# Patient Record
Sex: Male | Born: 1935 | Race: Black or African American | Hispanic: No | Marital: Married | Smoking: Never smoker
Health system: Southern US, Community
[De-identification: ages and names within clinical notes are randomized; demographics above are authoritative.]

## PROBLEM LIST (undated history)

## (undated) ENCOUNTER — Emergency Department (HOSPITAL_COMMUNITY): Admission: EM | Disposition: A | Discharge status: Home / Self Care | Payer: Medicare Other

## (undated) DIAGNOSIS — M5030 Other cervical disc degeneration, unspecified cervical region: Secondary | ICD-10-CM

## (undated) DIAGNOSIS — F0390 Unspecified dementia without behavioral disturbance: Secondary | ICD-10-CM

## (undated) DIAGNOSIS — IMO0001 Reserved for inherently not codable concepts without codable children: Secondary | ICD-10-CM

## (undated) DIAGNOSIS — G20 Parkinson's disease: Secondary | ICD-10-CM

## (undated) DIAGNOSIS — J45909 Unspecified asthma, uncomplicated: Secondary | ICD-10-CM

## (undated) DIAGNOSIS — K219 Gastro-esophageal reflux disease without esophagitis: Secondary | ICD-10-CM

## (undated) HISTORY — PX: HERNIA REPAIR: SHX51

## (undated) HISTORY — PX: EYE SURGERY: SHX253

---

## 2009-08-22 ENCOUNTER — Ambulatory Visit: Payer: Self-pay | Admitting: Unknown Physician Specialty

## 2011-06-02 ENCOUNTER — Observation Stay: Payer: Self-pay | Admitting: Internal Medicine

## 2011-06-06 ENCOUNTER — Emergency Department: Payer: Self-pay | Admitting: *Deleted

## 2011-06-12 ENCOUNTER — Ambulatory Visit: Payer: Self-pay | Admitting: Unknown Physician Specialty

## 2011-06-24 ENCOUNTER — Ambulatory Visit: Payer: Self-pay | Admitting: Internal Medicine

## 2011-07-01 ENCOUNTER — Ambulatory Visit: Payer: Self-pay | Admitting: Internal Medicine

## 2011-07-03 ENCOUNTER — Ambulatory Visit: Payer: Self-pay | Admitting: Unknown Physician Specialty

## 2011-07-25 ENCOUNTER — Ambulatory Visit: Payer: Self-pay | Admitting: Internal Medicine

## 2011-12-18 ENCOUNTER — Encounter: Payer: Self-pay | Admitting: Neurology

## 2011-12-23 ENCOUNTER — Encounter: Payer: Self-pay | Admitting: Neurology

## 2011-12-29 ENCOUNTER — Other Ambulatory Visit: Payer: Self-pay | Admitting: Radiology

## 2011-12-29 LAB — CREATININE, SERUM: Creatinine: 1.01 mg/dL (ref 0.60–1.30)

## 2012-01-11 ENCOUNTER — Ambulatory Visit: Payer: Self-pay | Admitting: Unknown Physician Specialty

## 2012-01-11 LAB — CREATININE, SERUM
Creatinine: 1.07 mg/dL (ref 0.60–1.30)
EGFR (Non-African Amer.): >60

## 2014-01-16 ENCOUNTER — Emergency Department: Payer: Self-pay | Admitting: Emergency Medicine

## 2014-06-12 ENCOUNTER — Ambulatory Visit: Payer: Self-pay | Admitting: Physician Assistant

## 2014-06-14 ENCOUNTER — Ambulatory Visit: Payer: Self-pay | Admitting: Surgery

## 2014-06-24 ENCOUNTER — Emergency Department: Payer: Self-pay | Admitting: Emergency Medicine

## 2014-06-24 LAB — URINALYSIS, COMPLETE
BILIRUBIN, UR: NEGATIVE
BLOOD: NEGATIVE
Bacteria: NONE SEEN
GLUCOSE, UR: NEGATIVE mg/dL (ref 0–75)
Leukocyte Esterase: NEGATIVE
Nitrite: NEGATIVE
PROTEIN: NEGATIVE
Ph: 5 (ref 4.5–8.0)
Specific Gravity: 1.017 (ref 1.003–1.030)
WBC UR: 1 /HPF (ref 0–5)

## 2014-06-25 LAB — PROTIME-INR
INR: 1.2
Prothrombin Time: 14.9 s — ABNORMAL HIGH

## 2014-06-25 LAB — COMPREHENSIVE METABOLIC PANEL WITH GFR
Albumin: 4.1 g/dL
Alkaline Phosphatase: 65 U/L
Anion Gap: 7
BUN: 23 mg/dL — ABNORMAL HIGH
Bilirubin,Total: 0.7 mg/dL
Calcium, Total: 8.8 mg/dL
Chloride: 109 mmol/L — ABNORMAL HIGH
Co2: 27 mmol/L
Creatinine: 1.01 mg/dL
EGFR (African American): >60
EGFR (Non-African Amer.): >60
Glucose: 117 mg/dL — ABNORMAL HIGH
Osmolality: 290
Potassium: 4.4 mmol/L
SGOT(AST): 28 U/L
SGPT (ALT): 16 U/L
Sodium: 143 mmol/L
Total Protein: 7.1 g/dL

## 2014-06-25 LAB — CBC
HCT: 35.8 % — ABNORMAL LOW
HGB: 11.8 g/dL — ABNORMAL LOW
MCH: 31.5 pg
MCHC: 33 g/dL
MCV: 95 fL
Platelet: 187 x10 3/mm 3
RBC: 3.76 x10 6/mm 3 — ABNORMAL LOW
RDW: 14.6 % — ABNORMAL HIGH
WBC: 7.1 x10 3/mm 3

## 2014-07-06 ENCOUNTER — Ambulatory Visit: Payer: Self-pay | Admitting: Surgery

## 2014-07-17 ENCOUNTER — Emergency Department: Payer: Self-pay | Admitting: Emergency Medicine

## 2014-07-17 LAB — COMPREHENSIVE METABOLIC PANEL
Albumin: 4 g/dL (ref 3.4–5.0)
Alkaline Phosphatase: 58 U/L
Anion Gap: 7 (ref 7–16)
BUN: 24 mg/dL — AB (ref 7–18)
Bilirubin,Total: 0.5 mg/dL (ref 0.2–1.0)
Calcium, Total: 8.8 mg/dL (ref 8.5–10.1)
Chloride: 108 mmol/L — ABNORMAL HIGH (ref 98–107)
Co2: 26 mmol/L (ref 21–32)
Creatinine: 0.99 mg/dL (ref 0.60–1.30)
EGFR (Non-African Amer.): >60
Glucose: 104 mg/dL — ABNORMAL HIGH (ref 65–99)
Osmolality: 286 (ref 275–301)
Potassium: 4.5 mmol/L (ref 3.5–5.1)
SGOT(AST): 18 U/L (ref 15–37)
SGPT (ALT): 8 U/L — ABNORMAL LOW
Sodium: 141 mmol/L (ref 136–145)
Total Protein: 6.9 g/dL (ref 6.4–8.2)

## 2014-07-17 LAB — CBC
HCT: 33.5 % — ABNORMAL LOW (ref 40.0–52.0)
HGB: 11.2 g/dL — AB (ref 13.0–18.0)
MCH: 31.7 pg (ref 26.0–34.0)
MCHC: 33.5 g/dL (ref 32.0–36.0)
MCV: 94 fL (ref 80–100)
Platelet: 213 10*3/uL (ref 150–440)
RBC: 3.55 10*6/uL — ABNORMAL LOW (ref 4.40–5.90)
RDW: 13.9 % (ref 11.5–14.5)
WBC: 7.2 10*3/uL (ref 3.8–10.6)

## 2014-07-17 LAB — URINALYSIS, COMPLETE
BLOOD: NEGATIVE
Bacteria: NONE SEEN
Bilirubin,UR: NEGATIVE
GLUCOSE, UR: NEGATIVE mg/dL (ref 0–75)
Leukocyte Esterase: NEGATIVE
Nitrite: NEGATIVE
Ph: 6 (ref 4.5–8.0)
Protein: NEGATIVE
RBC, UR: NONE SEEN /HPF (ref 0–5)
SPECIFIC GRAVITY: 1.018 (ref 1.003–1.030)
Squamous Epithelial: <1
WBC UR: 2 /HPF (ref 0–5)

## 2014-07-17 LAB — TROPONIN I

## 2014-12-15 NOTE — Op Note (Signed)
PATIENT NAME:  Willie Hale, Willie Hale MR#:  [id number] DATE OF BIRTH:  1934/04/05  DATE OF PROCEDURE:  07/06/2014  PREOPERATIVE DIAGNOSIS: Right inguinal hernia.   POSTOPERATIVE DIAGNOSIS: Right inguinal hernia.   PROCEDURE: Right inguinal hernia repair.   SURGEON: Renda RollsWilton Smith, MD  ANESTHESIA: General.   INDICATIONS: This 81 year old male has had bulging in the right groin. A right inguinal hernia was demonstrated on physical exam and repair was recommended for definitive treatment.   DESCRIPTION OF PROCEDURE: The patient was placed on the operating table in the supine position under general anesthesia. The right lower quadrant was clipped and prepared with ChloraPrep and draped in a sterile manner. A transversely oriented right suprapubic incision was made, carried down through subcutaneous tissues. Several small veins were divided with electrocautery. Scarpa fascia was incised. The external ring was identified. The external oblique aponeurosis was incised along the course of its fibers to open the external ring and expose the inguinal cord structures. The cord structures were mobilized and a Penrose drain passed around the cord structures. The floor of the inguinal canal appeared to be intact. Cremaster fibers were spread to demonstrate an indirect hernia sac which was somewhat thickened and was white in color and was dissected free from surrounding structures. Numerous small bleeding points were cauterized. The sac was approximately 10 cm in length and was dissected up into the internal ring. The sac was opened. Its continuity with the peritoneal cavity was demonstrated. A high ligation of the sac was done with a 0 Surgilon suture ligature. The sac was excised and the stump was allowed to retract. The sac was not submitted for pathology. Next, the floor of the inguinal canal was repaired with a row of 0 Surgilon sutures, beginning at the pubic tubercle, suturing the conjoined tendon to the shelving edge of  the inguinal ligament incorporating transversalis fascia into the repair. The last stitch led to satisfactory narrowing of the internal ring. Next, an onlay Bard soft mesh was used cutting out an oval shape portion of mesh of approximately 2.4 x 3.5 cm in dimension. A notch was cut out to straddle the internal ring. The mesh was sewn to the repair and also on both sides of the internal ring and also medially to the fascia. The repair looked good. Hemostasis was intact. Cord structures were replaced along the floor of the inguinal canal. The cut edges of the external oblique aponeurosis were closed with running 4-0 Vicryl. The deep fascia superior and lateral to the repair site was infiltrated with 0.5% Sensorcaine with epinephrine. Subcutaneous tissues were also infiltrated. The Scarpa fascia was closed with interrupted 4-0 Vicryl. The skin was closed with running 4-0 Monocryl subcuticular suture and Dermabond. The testicle remained in the scrotum. The patient tolerated surgery satisfactorily and was prepared for transfer to the recovery room. ____________________________ Shela CommonsJ. Renda RollsWilton Smith, MD jws:sb D: 07/06/2014 10:25:00 ET T: 07/06/2014 10:48:09 ET JOB#: [id number]  cc: Adella HareJ. Wilton Smith, MD, <Dictator> Adella HareWILTON J SMITH MD ELECTRONICALLY SIGNED 07/08/2014 8:03

## 2015-01-17 ENCOUNTER — Other Ambulatory Visit: Payer: Self-pay | Admitting: Physician Assistant

## 2015-01-17 DIAGNOSIS — R4702 Dysphasia: Secondary | ICD-10-CM

## 2015-01-28 ENCOUNTER — Ambulatory Visit
Admission: RE | Admit: 2015-01-28 | Discharge: 2015-01-28 | Disposition: A | Discharge status: Home / Self Care | Payer: Medicare Other | Source: Ambulatory Visit | Attending: Physician Assistant | Admitting: Physician Assistant

## 2015-01-28 DIAGNOSIS — N400 Enlarged prostate without lower urinary tract symptoms: Secondary | ICD-10-CM | POA: Insufficient documentation

## 2015-01-28 DIAGNOSIS — G20 Parkinson's disease: Secondary | ICD-10-CM | POA: Insufficient documentation

## 2015-01-28 DIAGNOSIS — R1312 Dysphagia, oropharyngeal phase: Secondary | ICD-10-CM | POA: Diagnosis not present

## 2015-01-28 DIAGNOSIS — R1310 Dysphagia, unspecified: Secondary | ICD-10-CM | POA: Diagnosis present

## 2015-01-28 DIAGNOSIS — R4702 Dysphasia: Secondary | ICD-10-CM

## 2015-01-28 NOTE — Therapy (Signed)
[facility] [address] [place], [place], [address] Phone: [phone]   Fax:     Modified Barium Swallow  Patient Details  Name: Willie Hale MRN: [id number] Date of Birth: 02-02-1934 Referring Provider:  Ignacia Bayley, PA-C  Encounter Date: 01/28/2015      End of Session - 01/28/15 1407    Visit Number 1   Number of Visits 1   Date for SLP Re-Evaluation 01/28/15   SLP Start Time 1300   SLP Stop Time  1400   SLP Time Calculation (min) 60 min   Activity Tolerance Patient tolerated treatment well       Visit Diagnosis: Dysphagia, oropharyngeal phase  Dysphasia - Plan: DG OP Swallowing Func-Medicare/Speech Path, DG OP Swallowing Func-Medicare/Speech Path   Subjective: Patient behavior: (alertness, ability to follow instructions, etc.): Chief complaint   Objective:  Radiological Procedure: A videoflouroscopic evaluation of oral-preparatory, reflex initiation, and pharyngeal phases of the swallow was performed; as well as a screening of the upper esophageal phase.  I. POSTURE: Upright in MBS chair.  Patient's body habitus places the pharynx parallel to the floor.   II. VIEW: Lateral  III. COMPENSATORY STRATEGIES: N/A  IV. BOLUSES ADMINISTERED:   Thin Liquid: 3 small sips, 2 large gulps   Nectar-thick Liquid: 1 sip    Puree: 2 teaspoons   Mechanical Soft: 1/4 graham cracker in apple sauce  V. RESULTS OF EVALUATION: A. ORAL PREPARATORY PHASE: (The lips, tongue, and velum are observed for strength and coordination) Within normal limits       **Overall Severity Rating: WNL  B. SWALLOW INITIATION/REFLEX: (The reflex is normal if "triggered" by the time the bolus reached the base of the tongue) Triggers while spilling from hte vallecular spaces to the pyriform sinuses for thin liquid.  **Overall Severity Rating: Mild  C. PHARYNGEAL PHASE: (Pharyngeal function is normal if the bolus shows rapid, smooth, and  continuous transit through the pharynx and there is no pharyngeal residue after the swallow) Decreased hyolaryngeal movement, decreased laryngeal vestibule closure at the height of the swallow, and reduced pharyngeal pressure generation (resulting in min vallecular residue).     **Overall Severity Rating: Mild  D. LARYNGEAL PENETRATION: (Material entering into the laryngeal inlet/vestibule but not aspirated) 4 of 5 thin liquid boluses with trace laryngeal penetration   E. ASPIRATION: None  F. ESOPHAGEAL PHASE: (Screening of the upper esophagus) No observed abnormalities in the upper cervical esophagus.  ASSESSMENT: This 81 year old man, with Parkinson's disease and complaints of food sticking or requiring extra time to move down, is presenting with mild oropharyngeal dysphagia characterized delayed pharyngeal swallow initiation, decreased hyolaryngeal movement, decreased laryngeal vestibule closure at the height of the swallow, and reduced pharyngeal pressure generation (resulting in pharyngeal residue).   With thin liquids, the patient demonstrates laryngeal penetration (with trace laryngeal vestibule residue) and no observed tracheal aspiration.  The patient is at risk for trace aspiration of laryngeal vestibule residue.  The patient and his sister report no none pulmonary illnesses, implying that the patient is tolerating aspiration, if it occurs.  The patient may benefit from a consult with GI to further evaluate his swallowing complaints.  PLAN/RECOMMENDATIONS:  A. Diet: Regular   B. Swallowing Precautions:  Follow reflux precautions   C. Recommended consultation to GI   D. Therapy recommendations: not indicated at this time   E. Results and recommendations were discussed with the patient and his sister, report routed to referring practitioner.  G-Codes - 01/28/15 1408    Functional Assessment Tool Used MBS   Functional Limitations Swallowing   Swallow Current Status (A5409(G8996) At  least 20 percent but less than 40 percent impaired, limited or restricted   Swallow Goal Status (W1191(G8997) At least 20 percent but less than 40 percent impaired, limited or restricted   Swallow Discharge Status [phone](G8998) At least 20 percent but less than 40 percent impaired, limited or restricted       Dollene PrimroseSusan G Sherryl Valido, MS/CCC- SLP   Leandrew KoyanagiAbernathy, Susie 01/28/2015, 2:09 PM  [facility] [address] [place], [place], [id number] Phone: [phone]   Fax:

## 2015-09-19 ENCOUNTER — Other Ambulatory Visit: Payer: Self-pay | Admitting: Neurology

## 2015-09-19 DIAGNOSIS — M542 Cervicalgia: Secondary | ICD-10-CM

## 2015-10-08 ENCOUNTER — Ambulatory Visit
Admission: RE | Admit: 2015-10-08 | Discharge: 2015-10-08 | Disposition: A | Discharge status: Home / Self Care | Payer: Medicare Other | Source: Ambulatory Visit | Attending: Neurology | Admitting: Neurology

## 2015-10-08 DIAGNOSIS — M47812 Spondylosis without myelopathy or radiculopathy, cervical region: Secondary | ICD-10-CM | POA: Diagnosis not present

## 2015-10-08 DIAGNOSIS — M542 Cervicalgia: Secondary | ICD-10-CM | POA: Diagnosis present

## 2015-10-08 DIAGNOSIS — M4802 Spinal stenosis, cervical region: Secondary | ICD-10-CM | POA: Insufficient documentation

## 2015-10-08 DIAGNOSIS — M50221 Other cervical disc displacement at C4-C5 level: Secondary | ICD-10-CM | POA: Insufficient documentation

## 2015-11-04 ENCOUNTER — Emergency Department: Payer: Medicare Other

## 2015-11-04 ENCOUNTER — Other Ambulatory Visit: Payer: Self-pay

## 2015-11-04 ENCOUNTER — Emergency Department
Admission: EM | Admit: 2015-11-04 | Discharge: 2015-11-04 | Disposition: A | Discharge status: Home / Self Care | Payer: Medicare Other | Attending: Emergency Medicine | Admitting: Emergency Medicine

## 2015-11-04 ENCOUNTER — Encounter: Payer: Self-pay | Admitting: Emergency Medicine

## 2015-11-04 DIAGNOSIS — Z79899 Other long term (current) drug therapy: Secondary | ICD-10-CM | POA: Insufficient documentation

## 2015-11-04 DIAGNOSIS — I951 Orthostatic hypotension: Secondary | ICD-10-CM | POA: Insufficient documentation

## 2015-11-04 DIAGNOSIS — R55 Syncope and collapse: Secondary | ICD-10-CM | POA: Diagnosis present

## 2015-11-04 HISTORY — DX: Reserved for inherently not codable concepts without codable children: IMO0001

## 2015-11-04 HISTORY — DX: Other cervical disc degeneration, unspecified cervical region: M50.30

## 2015-11-04 HISTORY — DX: Parkinson's disease: G20

## 2015-11-04 HISTORY — DX: Gastro-esophageal reflux disease without esophagitis: K21.9

## 2015-11-04 LAB — BASIC METABOLIC PANEL
Anion gap: 4 — ABNORMAL LOW (ref 5–15)
BUN: 32 mg/dL — AB (ref 6–20)
CO2: 28 mmol/L (ref 22–32)
Calcium: 9.5 mg/dL (ref 8.9–10.3)
Chloride: 108 mmol/L (ref 101–111)
Creatinine, Ser: 1.38 mg/dL — ABNORMAL HIGH (ref 0.61–1.24)
GFR calc Af Amer: 55 mL/min — ABNORMAL LOW (ref >60)
GFR, EST NON AFRICAN AMERICAN: 47 mL/min — AB (ref >60)
GLUCOSE: 122 mg/dL — AB (ref 65–99)
POTASSIUM: 4.4 mmol/L (ref 3.5–5.1)
Sodium: 140 mmol/L (ref 135–145)

## 2015-11-04 LAB — URINALYSIS COMPLETE WITH MICROSCOPIC (ARMC ONLY)
BILIRUBIN URINE: NEGATIVE
Bacteria, UA: NONE SEEN
Glucose, UA: NEGATIVE mg/dL
KETONES UR: NEGATIVE mg/dL
LEUKOCYTES UA: NEGATIVE
NITRITE: NEGATIVE
Protein, ur: NEGATIVE mg/dL
Specific Gravity, Urine: 1.01 (ref 1.005–1.030)
Squamous Epithelial / LPF: NONE SEEN
pH: 6 (ref 5.0–8.0)

## 2015-11-04 LAB — TROPONIN I
Troponin I: <0.03 ng/mL (ref <0.031)
Troponin I: <0.03 ng/mL (ref <0.031)

## 2015-11-04 LAB — HEPATIC FUNCTION PANEL
ALBUMIN: 4.6 g/dL (ref 3.5–5.0)
ALK PHOS: 56 U/L (ref 38–126)
ALT: 8 U/L — ABNORMAL LOW (ref 17–63)
AST: 23 U/L (ref 15–41)
BILIRUBIN TOTAL: 0.6 mg/dL (ref 0.3–1.2)
Bilirubin, Direct: <0.1 mg/dL — ABNORMAL LOW (ref 0.1–0.5)
Total Protein: 7.3 g/dL (ref 6.5–8.1)

## 2015-11-04 LAB — CBC
HEMATOCRIT: 32.6 % — AB (ref 40.0–52.0)
Hemoglobin: 10.9 g/dL — ABNORMAL LOW (ref 13.0–18.0)
MCH: 31.2 pg (ref 26.0–34.0)
MCHC: 33.5 g/dL (ref 32.0–36.0)
MCV: 93.3 fL (ref 80.0–100.0)
Platelets: 181 10*3/uL (ref 150–440)
RBC: 3.5 MIL/uL — ABNORMAL LOW (ref 4.40–5.90)
RDW: 13.9 % (ref 11.5–14.5)
WBC: 6.1 10*3/uL (ref 3.8–10.6)

## 2015-11-04 MED ORDER — SODIUM CHLORIDE 0.9 % IV SOLN
Freq: Once | INTRAVENOUS | Status: AC
  Administered 2015-11-04: 22:00:00 via INTRAVENOUS

## 2015-11-04 MED ORDER — SODIUM CHLORIDE 0.9 % IV BOLUS (SEPSIS)
1000.0000 mL | Freq: Once | INTRAVENOUS | Status: AC
  Administered 2015-11-04: 1000 mL via INTRAVENOUS

## 2015-11-04 NOTE — ED Provider Notes (Signed)
[facility] Emergency Department Provider Note  ____________________________________________  Time seen: Approximately 5:50 PM  I have reviewed the triage vital signs and the nursing notes.   HISTORY  Chief Complaint Loss of Consciousness    HPI Willie Hale is a 82 y.o. male with Parkinson's disease who reports he got weak and unsteady and fell he fell once in the bathroom his granddaughter says that his son found him unconscious in the bathroom he was out for maybe a minute or 2. He then got up and was walking in the house when his legs got weak and gave out on him. Patient has complained of some episodes of leg weakness lately but has not fallen or passed out. Patient also complains of feeling warm about a half an hour after he takes his antiparkinson medicine and also getting very shaky about a half an hour after that. He sees Dr. Sampson GoonFitzgerald here in town for his Parkinson medication. He did not have any other complaints fever etc.  Past Medical History  Diagnosis Date  . Parkinson disease (HCC)   . Reflux   . DDD (degenerative disc disease), cervical     There are no active problems to display for this patient.   Past Surgical History  Procedure Laterality Date  . Hernia repair    . Eye surgery      Current Outpatient Rx  Name  Route  Sig  Dispense  Refill  . carbidopa-levodopa (SINEMET IR) 25-250 MG tablet   Oral   Take 1 tablet by mouth 3 (three) times daily.         . pantoprazole (PROTONIX) 40 MG tablet   Oral   Take 40 mg by mouth daily.           Allergies Aspirin  History reviewed. No pertinent family history.  Social History Social History  Substance Use Topics  . Smoking status: Never Smoker   . Smokeless tobacco: None  . Alcohol Use: No    Review of Systems Constitutional: No fever/chills Eyes: No visual changes. ENT: No sore throat. Cardiovascular: Denies chest pain. Respiratory: Denies shortness of  breath. Gastrointestinal: No abdominal pain.  No nausea, no vomiting.  No diarrhea.  No constipation. Genitourinary: Negative for dysuria. Musculoskeletal: Negative for back pain. Skin: Negative for rash.   10-point ROS otherwise negative.  ____________________________________________   PHYSICAL EXAM:  VITAL SIGNS: ED Triage Vitals  Enc Vitals Group     BP 11/04/15 1635 96/78 mmHg     Pulse Rate 11/04/15 1635 65     Resp 11/04/15 1635 18     Temp 11/04/15 1635 97.7 F (36.5 C)     Temp Source 11/04/15 1635 Oral     SpO2 11/04/15 1635 100 %     Weight 11/04/15 1635 173 lb (78.472 kg)     Height 11/04/15 1635 6' (1.829 m)     Head Cir --      Peak Flow --      Pain Score 11/04/15 1638 3     Pain Loc --      Pain Edu? --      Excl. in GC? --     Constitutional: Alert and oriented. Well appearing and in no acute distress. Eyes: Conjunctivae are normal. PERRL. EOMI. Head: Atraumatic. Nose: No congestion/rhinnorhea. Mouth/Throat: Mucous membranes are moist.  Oropharynx non-erythematous. Neck: No stridor.  Cardiovascular: Normal rate, regular rhythm. Grossly normal heart sounds.  Good peripheral circulation. Respiratory: Normal respiratory effort.  No retractions.  Lungs CTAB. Gastrointestinal: Soft and nontender. No distention. No abdominal bruits. No CVA tenderness. Musculoskeletal: No lower extremity tenderness nor edema.  No joint effusions. Neurologic:  Normal speech and language. No gross focal neurologic deficits are appreciated. Patient does have a resting tremor diffusely.. Skin:  Skin is warm, dry and intact. No rash noted.   ____________________________________________   LABS (all labs ordered are listed, but only abnormal results are displayed)  Labs Reviewed  BASIC METABOLIC PANEL - Abnormal; Notable for the following:    Glucose, Bld 122 (*)    BUN 32 (*)    Creatinine, Ser 1.38 (*)    GFR calc non Af Amer 47 (*)    GFR calc Af Amer 55 (*)    Anion  gap 4 (*)    All other components within normal limits  CBC - Abnormal; Notable for the following:    RBC 3.50 (*)    Hemoglobin 10.9 (*)    HCT 32.6 (*)    All other components within normal limits  URINALYSIS COMPLETEWITH MICROSCOPIC ([facility] ONLY) - Abnormal; Notable for the following:    Color, Urine YELLOW (*)    APPearance CLEAR (*)    Hgb urine dipstick 1+ (*)    All other components within normal limits  HEPATIC FUNCTION PANEL - Abnormal; Notable for the following:    ALT 8 (*)    Bilirubin, Direct <0.1 (*)    All other components within normal limits  URINE CULTURE  TROPONIN I  TROPONIN I  CBG MONITORING, ED   ____________________________________________  EKG  EKG read and interpreted by me normal sinus rhythm rate of 69 normal axis no acute ST-T wave changes there is a very poor baseline however ____________________________________________  RADIOLOGY  CT of the head neck or read essentially as no acute disease by radiology. ____________________________________________   PROCEDURES  [facility] neurology was consult that. They feel that the flushing and the shaking after taking his Sinemet are just side effects of his Sinemet and would not change anything that way. They think he probably has orthostatic hypotension which he does. He is not dehydrated. He has not really changed after liter fluid either. I offered him admission to the hospital but he wants to go home. As this appears to be a chronic condition I will let him do so as long as he follows up with his regular doctors he promises to do.  ____________________________________________   INITIAL IMPRESSION / ASSESSMENT AND PLAN / ED COURSE  Pertinent labs & imaging results that were available during my care of the patient were reviewed by me and considered in my medical decision making (see chart for details).   ____________________________________________   FINAL CLINICAL IMPRESSION(S) / ED DIAGNOSES  Final  diagnoses:  Chronic orthostatic hypotension      Arnaldo Natal, MD 11/04/15 2151

## 2015-11-04 NOTE — ED Notes (Addendum)
Pt had syncopal episode with LOC for about 1 min this morning per granddaughter.  He then fell this afternoon as well.  C/o generalized weakness.  His blood pressure has been low per family.  Has parkinsons.  C/o head pain.  Does have abrasion to forehead.  bp 132/80 at [facility]. Today in 90s systolic.

## 2015-11-04 NOTE — ED Notes (Signed)
Called lab for add-on: processing now.

## 2015-11-04 NOTE — ED Notes (Signed)
Patient denies tripping over anything earlier in his bathroom. Patient states he started "blacking out" while he was standing there. Patient has a shallow laceration across mid-forehead and abrasion on bridge of nose.

## 2015-11-06 LAB — URINE CULTURE
Culture: 20000
SPECIAL REQUESTS: NORMAL

## 2016-06-23 ENCOUNTER — Emergency Department
Admission: EM | Admit: 2016-06-23 | Discharge: 2016-06-23 | Disposition: A | Discharge status: Home / Self Care | Payer: Medicare Other | Attending: Student in an Organized Health Care Education/Training Program | Admitting: Student in an Organized Health Care Education/Training Program

## 2016-06-23 ENCOUNTER — Emergency Department: Payer: Medicare Other

## 2016-06-23 DIAGNOSIS — J45909 Unspecified asthma, uncomplicated: Secondary | ICD-10-CM | POA: Diagnosis not present

## 2016-06-23 DIAGNOSIS — G20 Parkinson's disease: Secondary | ICD-10-CM | POA: Diagnosis not present

## 2016-06-23 DIAGNOSIS — Z79899 Other long term (current) drug therapy: Secondary | ICD-10-CM | POA: Diagnosis not present

## 2016-06-23 DIAGNOSIS — R197 Diarrhea, unspecified: Secondary | ICD-10-CM | POA: Insufficient documentation

## 2016-06-23 DIAGNOSIS — K6289 Other specified diseases of anus and rectum: Secondary | ICD-10-CM | POA: Diagnosis not present

## 2016-06-23 HISTORY — DX: Unspecified asthma, uncomplicated: J45.909

## 2016-06-23 LAB — CBC WITH DIFFERENTIAL/PLATELET
Basophils Absolute: 0.1 10*3/uL (ref 0–0.1)
Basophils Relative: 1 %
EOS PCT: 1 %
Eosinophils Absolute: 0 10*3/uL (ref 0–0.7)
HCT: 30.6 % — ABNORMAL LOW (ref 40.0–52.0)
Hemoglobin: 10.5 g/dL — ABNORMAL LOW (ref 13.0–18.0)
LYMPHS ABS: 1.2 10*3/uL (ref 1.0–3.6)
LYMPHS PCT: 15 %
MCH: 31.5 pg (ref 26.0–34.0)
MCHC: 34.3 g/dL (ref 32.0–36.0)
MCV: 91.7 fL (ref 80.0–100.0)
MONO ABS: 0.8 10*3/uL (ref 0.2–1.0)
MONOS PCT: 10 %
Neutro Abs: 5.9 10*3/uL (ref 1.4–6.5)
Neutrophils Relative %: 73 %
PLATELETS: 170 10*3/uL (ref 150–440)
RBC: 3.34 MIL/uL — AB (ref 4.40–5.90)
RDW: 14.3 % (ref 11.5–14.5)
WBC: 8 10*3/uL (ref 3.8–10.6)

## 2016-06-23 LAB — COMPREHENSIVE METABOLIC PANEL
ALT: 14 U/L — AB (ref 17–63)
AST: 21 U/L (ref 15–41)
Albumin: 4.1 g/dL (ref 3.5–5.0)
Alkaline Phosphatase: 54 U/L (ref 38–126)
Anion gap: 5 (ref 5–15)
BUN: 33 mg/dL — ABNORMAL HIGH (ref 6–20)
CALCIUM: 9.2 mg/dL (ref 8.9–10.3)
CHLORIDE: 109 mmol/L (ref 101–111)
CO2: 26 mmol/L (ref 22–32)
CREATININE: 1.1 mg/dL (ref 0.61–1.24)
Glucose, Bld: 101 mg/dL — ABNORMAL HIGH (ref 65–99)
Potassium: 3.7 mmol/L (ref 3.5–5.1)
Sodium: 140 mmol/L (ref 135–145)
Total Bilirubin: 0.8 mg/dL (ref 0.3–1.2)
Total Protein: 7.1 g/dL (ref 6.5–8.1)

## 2016-06-23 LAB — GASTROINTESTINAL PANEL BY PCR, STOOL (REPLACES STOOL CULTURE)
ASTROVIRUS: NOT DETECTED
Adenovirus F40/41: NOT DETECTED
CAMPYLOBACTER SPECIES: NOT DETECTED
CYCLOSPORA CAYETANENSIS: NOT DETECTED
Cryptosporidium: NOT DETECTED
ENTEROTOXIGENIC E COLI (ETEC): NOT DETECTED
Entamoeba histolytica: NOT DETECTED
Enteroaggregative E coli (EAEC): NOT DETECTED
Enteropathogenic E coli (EPEC): NOT DETECTED
Giardia lamblia: NOT DETECTED
NOROVIRUS GI/GII: NOT DETECTED
PLESIMONAS SHIGELLOIDES: NOT DETECTED
ROTAVIRUS A: NOT DETECTED
SAPOVIRUS (I, II, IV, AND V): NOT DETECTED
SHIGA LIKE TOXIN PRODUCING E COLI (STEC): NOT DETECTED
Salmonella species: NOT DETECTED
Shigella/Enteroinvasive E coli (EIEC): NOT DETECTED
Vibrio cholerae: NOT DETECTED
Vibrio species: NOT DETECTED
Yersinia enterocolitica: NOT DETECTED

## 2016-06-23 MED ORDER — IOPAMIDOL (ISOVUE-300) INJECTION 61%
30.0000 mL | INTRAVENOUS | Status: AC
  Administered 2016-06-23: 30 mL via ORAL

## 2016-06-23 MED ORDER — METRONIDAZOLE 500 MG PO TABS: 500.0000 mg | ORAL_TABLET | Freq: Three times a day (TID) | ORAL | 0 refills | Status: AC

## 2016-06-23 MED ORDER — SODIUM CHLORIDE 0.9 % IV BOLUS (SEPSIS)
1000.0000 mL | Freq: Once | INTRAVENOUS | Status: AC
  Administered 2016-06-23: 1000 mL via INTRAVENOUS

## 2016-06-23 MED ORDER — CIPROFLOXACIN HCL 500 MG PO TABS: 500.0000 mg | ORAL_TABLET | Freq: Two times a day (BID) | ORAL | 0 refills | Status: AC

## 2016-06-23 MED ORDER — BISACODYL 10 MG RE SUPP: 10.0000 mg | RECTAL | 0 refills | Status: AC | PRN

## 2016-06-23 MED ORDER — CARBIDOPA-LEVODOPA 25-250 MG PO TABS
1.0000 | ORAL_TABLET | Freq: Three times a day (TID) | ORAL | Status: DC
  Administered 2016-06-23: 1 via ORAL
  Filled 2016-06-23 (×2): qty 1

## 2016-06-23 MED ORDER — IOPAMIDOL (ISOVUE-300) INJECTION 61%
100.0000 mL | Freq: Once | INTRAVENOUS | Status: AC | PRN
  Administered 2016-06-23: 100 mL via INTRAVENOUS

## 2016-06-23 NOTE — ED Triage Notes (Signed)
Pt to triage via w/c with no distress noted; pt reports diarrhea since yesterday

## 2016-06-23 NOTE — ED Notes (Signed)
Mr. Willie Hale's friend, Eveline Ketoanya Haith, was called for transport home.

## 2016-06-23 NOTE — ED Notes (Signed)
Mr. Willie Hale was discharged from the ED. He left the ED with family friend, Eveline Ketoanya Haith. Mr. Willie Hale was transported to her car by wheelchair.  Son was called and informed of discharge instruction. Discharge instructions were given to friend. Due to parkinsons, Mr. Willie Hale was unable to sign discharge documentation.

## 2016-06-23 NOTE — ED Provider Notes (Signed)
[facility] Emergency Department Provider Note    First MD Initiated Contact with Patient 06/23/16 0300     (approximate)  I have reviewed the triage vital signs and the nursing notes.   HISTORY  Chief Complaint Diarrhea    HPI Docia FurlJoe Tenorio is a 82 y.o. male with a history of gastric reflux as well as Parkinson's disease presents with several episodes of nonbloody non-melanotic diarrhea associated with nausea and vomiting. No reported fevers.  Denies any abdominal pain at this time but did have some abdominal pain prior to arrival. Does have a history of hernia repair. Denies any chest pain or shortness of breath. He is not on any blood thinners. Is not been on any recent antibiotics. Denies any recent sick contacts or eating any raw food.   Past Medical History:  Diagnosis Date  . Asthma   . DDD (degenerative disc disease), cervical   . Parkinson disease (HCC)   . Reflux     There are no active problems to display for this patient.   Past Surgical History:  Procedure Laterality Date  . EYE SURGERY    . HERNIA REPAIR      Prior to Admission medications   Medication Sig Start Date End Date Taking? Authorizing Provider  bisacodyl (DULCOLAX) 10 MG suppository Place 1 suppository (10 mg total) rectally as needed for moderate constipation. 06/23/16   Willy EddyPatrick Jacarie Pate, MD  carbidopa-levodopa (SINEMET IR) 25-250 MG tablet Take 1 tablet by mouth 3 (three) times daily.    Historical Provider, MD  ciprofloxacin (CIPRO) 500 MG tablet Take 1 tablet (500 mg total) by mouth 2 (two) times daily. 06/23/16 06/30/16  Willy EddyPatrick Kumari Sculley, MD  metroNIDAZOLE (FLAGYL) 500 MG tablet Take 1 tablet (500 mg total) by mouth 3 (three) times daily. 06/23/16 06/30/16  Willy EddyPatrick Maher Shon, MD  pantoprazole (PROTONIX) 40 MG tablet Take 40 mg by mouth daily.    Historical Provider, MD    Allergies Aspirin  No family history on file.  Social History Social History  Substance Use  Topics  . Smoking status: Never Smoker  . Smokeless tobacco: Not on file  . Alcohol use No    Review of Systems Patient denies headaches, rhinorrhea, blurry vision, numbness, shortness of breath, chest pain, edema, cough, abdominal pain, nausea, vomiting, diarrhea, dysuria, fevers, rashes or hallucinations unless otherwise stated above in HPI. ____________________________________________   PHYSICAL EXAM:  VITAL SIGNS: Vitals:   06/23/16 0700 06/23/16 0716  BP: (!) 148/97 138/75  Pulse: 67   Resp: 18   Temp:      Constitutional: Alert and oriented. Elderly non toxic appearing  Eyes: Conjunctivae are normal. PERRL. EOMI. Head: Atraumatic. Nose: No congestion/rhinnorhea. Mouth/Throat: Mucous membranes are moist.  Oropharynx non-erythematous. Neck: No stridor. Painless ROM. No cervical spine tenderness to palpation Hematological/Lymphatic/Immunilogical: No cervical lymphadenopathy. Cardiovascular: Normal rate, regular rhythm. Grossly normal heart sounds.  Good peripheral circulation. Respiratory: Normal respiratory effort.  No retractions. Lungs CTAB. Gastrointestinal: Soft and nontender. No distention. No abdominal bruits. No CVA tenderness. Musculoskeletal: No lower extremity tenderness nor edema.  No joint effusions. Neurologic:  Normal speech and language. No gross focal neurologic deficits are appreciated. No gait instability.  Chronic right sided resting tremor Skin:  Skin is warm, dry and intact. No rash noted. Psychiatric: Mood and affect are normal. Speech and behavior are normal.  ____________________________________________   LABS (all labs ordered are listed, but only abnormal results are displayed)  Results for orders placed or performed during the hospital  encounter of 06/23/16 (from the past 24 hour(s))  CBC with Differential/Platelet     Status: Abnormal   Collection Time: 06/23/16  3:48 AM  Result Value Ref Range   WBC 8.0 3.8 - 10.6 K/uL   RBC 3.34 (L)  4.40 - 5.90 MIL/uL   Hemoglobin 10.5 (L) 13.0 - 18.0 g/dL   HCT 78.230.6 (L) 95.640.0 - 21.352.0 %   MCV 91.7 80.0 - 100.0 fL   MCH 31.5 26.0 - 34.0 pg   MCHC 34.3 32.0 - 36.0 g/dL   RDW 08.614.3 57.811.5 - 46.914.5 %   Platelets 170 150 - 440 K/uL   Neutrophils Relative % 73 %   Neutro Abs 5.9 1.4 - 6.5 K/uL   Lymphocytes Relative 15 %   Lymphs Abs 1.2 1.0 - 3.6 K/uL   Monocytes Relative 10 %   Monocytes Absolute 0.8 0.2 - 1.0 K/uL   Eosinophils Relative 1 %   Eosinophils Absolute 0.0 0 - 0.7 K/uL   Basophils Relative 1 %   Basophils Absolute 0.1 0 - 0.1 K/uL  Comprehensive metabolic panel     Status: Abnormal   Collection Time: 06/23/16  3:48 AM  Result Value Ref Range   Sodium 140 135 - 145 mmol/L   Potassium 3.7 3.5 - 5.1 mmol/L   Chloride 109 101 - 111 mmol/L   CO2 26 22 - 32 mmol/L   Glucose, Bld 101 (H) 65 - 99 mg/dL   BUN 33 (H) 6 - 20 mg/dL   Creatinine, Ser 6.291.10 0.61 - 1.24 mg/dL   Calcium 9.2 8.9 - 52.810.3 mg/dL   Total Protein 7.1 6.5 - 8.1 g/dL   Albumin 4.1 3.5 - 5.0 g/dL   AST 21 15 - 41 U/L   ALT 14 (L) 17 - 63 U/L   Alkaline Phosphatase 54 38 - 126 U/L   Total Bilirubin 0.8 0.3 - 1.2 mg/dL   GFR calc non Af Amer >60 >60 mL/min   GFR calc Af Amer >60 >60 mL/min   Anion gap 5 5 - 15   ____________________________________________ ____________________________  RADIOLOGY  I personally reviewed all radiographic images ordered to evaluate for the above acute complaints and reviewed radiology reports and findings.  These findings were personally discussed with the patient.  Please see medical record for radiology report. ____________________________________________   PROCEDURES  Procedure(s) performed: none    Critical Care performed: no ____________________________________________   INITIAL IMPRESSION / ASSESSMENT AND PLAN / ED COURSE  Pertinent labs & imaging results that were available during my care of the patient were reviewed by me and considered in my medical decision  making (see chart for details).  DDX: colitis, diverticulitis, mass, sbo, constipation  Gabriel RungJoe Dareen Pianonderson is a 82 y.o. who presents to the ED with Complaint of diarrhea since yesterday. Patient also reports 1 episode of vomiting. Denies any abdominal pain at this time but did have brief episode prior to arrival since he with the diarrhea. He is afebrile and hemodynamically stable. His abdominal exam is reassuring at this time. Blood work was ordered and is otherwise reassuring as well. Based on his age and description of brief episode of pain associated with diarrhea diarrhea CT imaging ordered to evaluate for the above differential.  The patient will be placed on continuous pulse oximetry and telemetry for monitoring.  Laboratory evaluation will be sent to evaluate for the above complaints.     Clinical Course  Comment By Time  Discussed results of the scan with patient. Repeat  abdominal exam is reassuring. I recommended that we perform a rectal exam see the patient got any relief from possible fecal impaction. Patient declined this stating that he's had multiple episodes of diarrhea and denies any rectal pain or difficulty passing stools. Based on complain of multiple episodes of diarrhea and CT imaging showing evidence of proctitis will discharge with Cipro Flagyl. Patient is tolerating oral hydration. Otherwise hemodynamic stable. Due to the patient is appropriate for outpatient management is he has not had any significant diarrhea since being in the ER for the past 5 hours and shows no evidence of dehydration.  Have discussed with the patient and available family all diagnostics and treatments performed thus far and all questions were answered to the best of my ability. The patient demonstrates understanding and agreement with plan.  Willy Eddy, MD 10/31 [phone]     ____________________________________________   FINAL CLINICAL IMPRESSION(S) / ED DIAGNOSES  Final diagnoses:  Diarrhea of  presumed infectious origin  Proctitis      NEW MEDICATIONS STARTED DURING THIS VISIT:  New Prescriptions   BISACODYL (DULCOLAX) 10 MG SUPPOSITORY    Place 1 suppository (10 mg total) rectally as needed for moderate constipation.   CIPROFLOXACIN (CIPRO) 500 MG TABLET    Take 1 tablet (500 mg total) by mouth 2 (two) times daily.   METRONIDAZOLE (FLAGYL) 500 MG TABLET    Take 1 tablet (500 mg total) by mouth 3 (three) times daily.     Note:  This document was prepared using Dragon voice recognition software and may include unintentional dictation errors.    Willy Eddy, MD 06/23/16 [phone]

## 2016-06-23 NOTE — ED Notes (Signed)
Pt reports that has not taken his carbidopa-levodopa x1 day due to diarrhea.

## 2016-08-09 ENCOUNTER — Emergency Department
Admission: EM | Admit: 2016-08-09 | Discharge: 2016-08-09 | Disposition: A | Discharge status: Home / Self Care | Payer: Medicare Other | Attending: Emergency Medicine | Admitting: Emergency Medicine

## 2016-08-09 ENCOUNTER — Encounter: Payer: Self-pay | Admitting: Emergency Medicine

## 2016-08-09 DIAGNOSIS — J45909 Unspecified asthma, uncomplicated: Secondary | ICD-10-CM | POA: Diagnosis not present

## 2016-08-09 DIAGNOSIS — R42 Dizziness and giddiness: Secondary | ICD-10-CM | POA: Insufficient documentation

## 2016-08-09 DIAGNOSIS — Z79899 Other long term (current) drug therapy: Secondary | ICD-10-CM | POA: Insufficient documentation

## 2016-08-09 DIAGNOSIS — R531 Weakness: Secondary | ICD-10-CM | POA: Diagnosis not present

## 2016-08-09 DIAGNOSIS — G20 Parkinson's disease: Secondary | ICD-10-CM | POA: Diagnosis not present

## 2016-08-09 HISTORY — DX: Unspecified dementia, unspecified severity, without behavioral disturbance, psychotic disturbance, mood disturbance, and anxiety: F03.90

## 2016-08-09 LAB — URINALYSIS, COMPLETE (UACMP) WITH MICROSCOPIC
BILIRUBIN URINE: NEGATIVE
Bacteria, UA: NONE SEEN
Glucose, UA: NEGATIVE mg/dL
Ketones, ur: NEGATIVE mg/dL
Leukocytes, UA: NEGATIVE
Nitrite: NEGATIVE
PH: 5 (ref 5.0–8.0)
Protein, ur: NEGATIVE mg/dL
SPECIFIC GRAVITY, URINE: 1.017 (ref 1.005–1.030)
SQUAMOUS EPITHELIAL / LPF: NONE SEEN

## 2016-08-09 LAB — CBC WITH DIFFERENTIAL/PLATELET
Basophils Absolute: 0 10*3/uL (ref 0–0.1)
Basophils Relative: 1 %
EOS ABS: 0.1 10*3/uL (ref 0–0.7)
EOS PCT: 1 %
HCT: 31.3 % — ABNORMAL LOW (ref 40.0–52.0)
Hemoglobin: 10.7 g/dL — ABNORMAL LOW (ref 13.0–18.0)
LYMPHS ABS: 1.2 10*3/uL (ref 1.0–3.6)
LYMPHS PCT: 20 %
MCH: 31.2 pg (ref 26.0–34.0)
MCHC: 34.1 g/dL (ref 32.0–36.0)
MCV: 91.3 fL (ref 80.0–100.0)
MONO ABS: 0.6 10*3/uL (ref 0.2–1.0)
Monocytes Relative: 10 %
Neutro Abs: 4 10*3/uL (ref 1.4–6.5)
Neutrophils Relative %: 68 %
PLATELETS: 179 10*3/uL (ref 150–440)
RBC: 3.43 MIL/uL — ABNORMAL LOW (ref 4.40–5.90)
RDW: 13.6 % (ref 11.5–14.5)
WBC: 5.8 10*3/uL (ref 3.8–10.6)

## 2016-08-09 LAB — COMPREHENSIVE METABOLIC PANEL
ALBUMIN: 4 g/dL (ref 3.5–5.0)
ALT: 11 U/L — AB (ref 17–63)
AST: 22 U/L (ref 15–41)
Alkaline Phosphatase: 61 U/L (ref 38–126)
Anion gap: 4 — ABNORMAL LOW (ref 5–15)
BILIRUBIN TOTAL: 1 mg/dL (ref 0.3–1.2)
BUN: 29 mg/dL — AB (ref 6–20)
CHLORIDE: 107 mmol/L (ref 101–111)
CO2: 27 mmol/L (ref 22–32)
CREATININE: 1.2 mg/dL (ref 0.61–1.24)
Calcium: 9.2 mg/dL (ref 8.9–10.3)
GFR calc Af Amer: >60 mL/min (ref >60)
GFR, EST NON AFRICAN AMERICAN: 55 mL/min — AB (ref >60)
GLUCOSE: 108 mg/dL — AB (ref 65–99)
POTASSIUM: 4.1 mmol/L (ref 3.5–5.1)
Sodium: 138 mmol/L (ref 135–145)
Total Protein: 7.2 g/dL (ref 6.5–8.1)

## 2016-08-09 LAB — TROPONIN I

## 2016-08-09 MED ORDER — SODIUM CHLORIDE 0.9 % IV BOLUS (SEPSIS)
500.0000 mL | Freq: Once | INTRAVENOUS | Status: AC
  Administered 2016-08-09: 500 mL via INTRAVENOUS

## 2016-08-09 NOTE — Discharge Instructions (Signed)
Please return immediately if condition worsens. Please contact her primary physician or the physician you were given for referral. If you have any specialist physicians involved in her treatment and plan please also contact them. Thank you for using [facility]. ° °

## 2016-08-09 NOTE — ED Provider Notes (Signed)
Time Seen: Approximately 0827  I have reviewed the triage notes  Chief Complaint: Weakness and Dizziness   History of Present Illness: Willie Hale is a 82 y.o. male *who presents with complaints of generalized weakness and lightheadedness that seems to be accelerating over the last several weeks. Patient states the headache without any focal neurologic deficits such as weakness in the upper or lower extremities etc. He describes episodes of feeling that his "" legs are giving out "". Review of the records shows these don't seem to be new findings for the patient has a history of dementia along with postural hypotension from his Parkinson's disease. Family arrived later and states that the patient seems to have some increasing difficulty ambulating at home. No syncopal episode. He apparently has a very difficult living environment with his wife actually requiring more medical needs and the patient at this time. The patient denies any physical pain at this time. There was no evidence of head trauma, neck pain, thoracic or lumbar spine pain etc.   Past Medical History:  Diagnosis Date  . Asthma   . DDD (degenerative disc disease), cervical   . Dementia   . Parkinson disease (HCC)   . Reflux     There are no active problems to display for this patient.   Past Surgical History:  Procedure Laterality Date  . EYE SURGERY    . HERNIA REPAIR      Past Surgical History:  Procedure Laterality Date  . EYE SURGERY    . HERNIA REPAIR      Current Outpatient Rx  . Order #: [id number] Class: Historical Med  . Order #: [id number] Class: Historical Med  . Order #: [id number] Class: Print    Allergies:  Aspirin  Family History: No family history on file.  Social History: Social History  Substance Use Topics  . Smoking status: Never Smoker  . Smokeless tobacco: Never Used  . Alcohol use No     Review of Systems:   10 point review of systems was performed and was otherwise  negative:  Constitutional: No fever Eyes: No visual disturbances ENT: No sore throat, ear pain Cardiac: No chest pain Respiratory: No shortness of breath, wheezing, or stridor Abdomen: No abdominal pain, no vomiting, No diarrhea Endocrine: No weight loss, No night sweats Extremities: No peripheral edema, cyanosis Skin: No rashes, easy bruising Neurologic: No focal weakness, trouble with speech or swollowing Urologic: No dysuria, Hematuria, or urinary frequency   Physical Exam:  ED Triage Vitals  Enc Vitals Group     BP 08/09/16 0800 (!) 152/89     Pulse Rate 08/09/16 0815 70     Resp 08/09/16 0808 18     Temp 08/09/16 0808 98 F (36.7 C)     Temp Source 08/09/16 0808 Oral     SpO2 08/09/16 0808 100 %     Weight 08/09/16 0808 180 lb (81.6 kg)     Height 08/09/16 0808 6\' 2"  (1.88 m)     Head Circumference --      Peak Flow --      Pain Score 08/09/16 0809 0     Pain Loc --      Pain Edu? --      Excl. in GC? --     General: Awake , Alert , and Oriented times2, Glasgow Coma Scale 15 Head: Normal cephalic , atraumatic Eyes: Pupils equal , round, reactive to light Nose/Throat: No nasal drainage, patent upper airway without erythema or exudate.  Neck:  Supple, Full range of motion, No anterior adenopathy or palpable thyroid masses Lungs: Clear to ascultation without wheezes , rhonchi, or rales Heart: Regular rate, regular rhythm without murmurs , gallops , or rubs Abdomen: Soft, non tender without rebound, guarding , or rigidity; bowel sounds positive and symmetric in all 4 quadrants. No organomegaly .        Extremities: 2 plus symmetric pulses. No edema, clubbing or cyanosis Neurologic: normal ambulation for the patient in the room after testing was performed., Motor symmetric without deficits, sensory intact Skin: warm, dry, no rashes   Labs:   All laboratory work was reviewed including any pertinent negatives or positives listed below:  Labs Reviewed  COMPREHENSIVE  METABOLIC PANEL - Abnormal; Notable for the following:       Result Value   Glucose, Bld 108 (*)    BUN 29 (*)    ALT 11 (*)    GFR calc non Af Amer 55 (*)    Anion gap 4 (*)    All other components within normal limits  CBC WITH DIFFERENTIAL/PLATELET - Abnormal; Notable for the following:    RBC 3.43 (*)    Hemoglobin 10.7 (*)    HCT 31.3 (*)    All other components within normal limits  URINALYSIS, COMPLETE (UACMP) WITH MICROSCOPIC - Abnormal; Notable for the following:    Color, Urine YELLOW (*)    APPearance CLEAR (*)    Hgb urine dipstick SMALL (*)    All other components within normal limits  TROPONIN I  Laboratory work was reviewed and showed no clinically significant abnormalities.   EKG:  ED ECG REPORT I, Jennye MoccasinBrian S Tamra Koos, the attending physician, personally viewed and interpreted this ECG.  Date: 08/09/2016 EKG Time: 0803 Rate: 76 Rhythm: normal sinus rhythm with bigeminy QRS Axis: normal Intervals: normal ST/T Wave abnormalities: normal Conduction Disturbances: none Narrative Interpretation: unremarkable No acute ischemic changes   ED Course:  \Patient's stay here was uneventful and the patient does not seem to have any new findings based on his history and physical exam. He has a history of orthostatic hypotension EMS reported blood pressure readings of 60 systolic at home. Here the patient's blood pressure if anything does remain hypertensive. Was seen by the patient relation personnel for advice on further increasing home care to assist the family. Patient was able to demonstrate ambulation here at his baseline with no focal neurologic deficits. Clinical Course      Assessment: * Acute exacerbation of chronic orthostatic hypotension Parkinson's disease Dementia   Final Clinical Impression: *  Final diagnoses:  Weakness     Plan: * Outpatient Patient was advised to return immediately if condition worsens. Patient was advised to follow up with  their primary care physician or other specialized physicians involved in their outpatient care. The patient and/or family member/power of attorney had laboratory results reviewed at the bedside. All questions and concerns were addressed and appropriate discharge instructions were distributed by the nursing staff.            Jennye MoccasinBrian S Kayliegh Boyers, MD 08/09/16 [phone]

## 2016-08-09 NOTE — ED Triage Notes (Signed)
Pt in via EMS with complaints of generalized weakness and dizziness this morning.  EMS reports manual BP reading of 60 systolic.  Pt with complaints of a headache.  Pt with hx of dementia and parkinsons.  Pt states, "I feel like my legs are going to give out."  Vitals WDL, no immediate distress noted at this time.  MD at bedside.

## 2016-08-09 NOTE — ED Notes (Signed)
Pt ambulated approximately 40 feet with standby assistance.  Pt reports feeling like his legs are going to give out; pt with hx of parkinsons.  Pt at baseline ambulatory status per pt granddaughter.  Case management to see pt prior to discharge.

## 2016-08-09 NOTE — Care Management Note (Signed)
Case Management Note  Patient Details  Name: Docia FurlJoe Colebank MRN: [id number] Date of Birth: 1933/12/02  Subjective/Objective:  Colette RibasGrandaughter Elaina [place] [facility]                  Action/Plan:Information given [facility], personal care services list, Home health agencies and assisted living facilities. There is currently a sitter in the home for the grandmother.    Expected Discharge Date:                  Expected Discharge Plan:     In-House Referral:     Discharge planning Services     Post Acute Care Choice:    Choice offered to:     DME Arranged:    DME Agency:     HH Arranged:    HH Agency:     Status of Service:     If discussed at [employer] Meetings, dates discussed:    Additional Comments:Dr Huel CoteQuigley informed of above services /information provided.   Caren MacadamMichelle Shandrell Boda, RN 08/09/2016, 10:38 AM

## 2016-08-13 ENCOUNTER — Emergency Department
Admission: EM | Admit: 2016-08-13 | Discharge: 2016-08-13 | Disposition: A | Discharge status: Home / Self Care | Payer: Medicare Other | Attending: Emergency Medicine | Admitting: Emergency Medicine

## 2016-08-13 ENCOUNTER — Emergency Department: Payer: Medicare Other

## 2016-08-13 ENCOUNTER — Encounter: Payer: Self-pay | Admitting: Emergency Medicine

## 2016-08-13 DIAGNOSIS — G20 Parkinson's disease: Secondary | ICD-10-CM | POA: Diagnosis not present

## 2016-08-13 DIAGNOSIS — G909 Disorder of the autonomic nervous system, unspecified: Secondary | ICD-10-CM

## 2016-08-13 DIAGNOSIS — G908 Other disorders of autonomic nervous system: Secondary | ICD-10-CM | POA: Insufficient documentation

## 2016-08-13 DIAGNOSIS — Z79899 Other long term (current) drug therapy: Secondary | ICD-10-CM | POA: Diagnosis not present

## 2016-08-13 DIAGNOSIS — J45909 Unspecified asthma, uncomplicated: Secondary | ICD-10-CM | POA: Insufficient documentation

## 2016-08-13 DIAGNOSIS — R531 Weakness: Secondary | ICD-10-CM | POA: Diagnosis present

## 2016-08-13 DIAGNOSIS — R55 Syncope and collapse: Secondary | ICD-10-CM

## 2016-08-13 LAB — CBC WITH DIFFERENTIAL/PLATELET
BASOS PCT: 0 %
Basophils Absolute: 0 10*3/uL (ref 0–0.1)
EOS ABS: 0.1 10*3/uL (ref 0–0.7)
EOS PCT: 1 %
HCT: 30.6 % — ABNORMAL LOW (ref 40.0–52.0)
Hemoglobin: 10.3 g/dL — ABNORMAL LOW (ref 13.0–18.0)
Lymphocytes Relative: 19 %
Lymphs Abs: 1.4 10*3/uL (ref 1.0–3.6)
MCH: 30.9 pg (ref 26.0–34.0)
MCHC: 33.8 g/dL (ref 32.0–36.0)
MCV: 91.4 fL (ref 80.0–100.0)
MONO ABS: 0.8 10*3/uL (ref 0.2–1.0)
MONOS PCT: 11 %
Neutro Abs: 4.9 10*3/uL (ref 1.4–6.5)
Neutrophils Relative %: 69 %
PLATELETS: 206 10*3/uL (ref 150–440)
RBC: 3.34 MIL/uL — ABNORMAL LOW (ref 4.40–5.90)
RDW: 13.6 % (ref 11.5–14.5)
WBC: 7.2 10*3/uL (ref 3.8–10.6)

## 2016-08-13 LAB — URINALYSIS, COMPLETE (UACMP) WITH MICROSCOPIC
BILIRUBIN URINE: NEGATIVE
Bacteria, UA: NONE SEEN
Glucose, UA: NEGATIVE mg/dL
KETONES UR: 5 mg/dL — AB
Leukocytes, UA: NEGATIVE
Nitrite: NEGATIVE
Protein, ur: NEGATIVE mg/dL
SPECIFIC GRAVITY, URINE: 1.018 (ref 1.005–1.030)
SQUAMOUS EPITHELIAL / LPF: NONE SEEN
WBC, UA: NONE SEEN WBC/hpf (ref 0–5)
pH: 5 (ref 5.0–8.0)

## 2016-08-13 LAB — BASIC METABOLIC PANEL
Anion gap: 5 (ref 5–15)
BUN: 31 mg/dL — AB (ref 6–20)
CALCIUM: 9 mg/dL (ref 8.9–10.3)
CO2: 28 mmol/L (ref 22–32)
CREATININE: 1.38 mg/dL — AB (ref 0.61–1.24)
Chloride: 106 mmol/L (ref 101–111)
GFR calc non Af Amer: 47 mL/min — ABNORMAL LOW (ref >60)
GFR, EST AFRICAN AMERICAN: 54 mL/min — AB (ref >60)
Glucose, Bld: 102 mg/dL — ABNORMAL HIGH (ref 65–99)
Potassium: 3.8 mmol/L (ref 3.5–5.1)
SODIUM: 139 mmol/L (ref 135–145)

## 2016-08-13 LAB — TROPONIN I

## 2016-08-13 MED ORDER — SODIUM CHLORIDE 0.9 % IV BOLUS (SEPSIS)
1000.0000 mL | Freq: Once | INTRAVENOUS | Status: AC
  Administered 2016-08-13: 1000 mL via INTRAVENOUS

## 2016-08-13 NOTE — ED Notes (Signed)
ED Provider at bedside. 

## 2016-08-13 NOTE — ED Provider Notes (Signed)
[facility] Emergency Department Provider Note  ____________________________________________   First MD Initiated Contact with Patient 08/13/16 1210     (approximate)  I have reviewed the triage vital signs and the nursing notes.   HISTORY  Chief Complaint Weakness   HPI Willie Hale is a 82 y.o. male with a history of dementia, Parkinson's disease and orthostatic hypotension was presenting after single episode today. His daughter say that he was walking when his knees "gave out on him." He has had multiple episodes of this in the past and was just her on December 17. He was diagnosed with his usual orthostatic hypotension. Likely related to his Parkinson's disease. The patient is complaining of pain to the back of his head at this time. However, his daughters who are here with him said that he fell and hit the front of his head today. He denies being on any blood thinners. Denies any weakness or numbness. Denies any pain in his extremities or torso.   Past Medical History:  Diagnosis Date  . Asthma   . DDD (degenerative disc disease), cervical   . Dementia   . Parkinson disease (HCC)   . Reflux     There are no active problems to display for this patient.   Past Surgical History:  Procedure Laterality Date  . EYE SURGERY    . HERNIA REPAIR      Prior to Admission medications   Medication Sig Start Date End Date Taking? Authorizing Provider  bisacodyl (DULCOLAX) 10 MG suppository Place 1 suppository (10 mg total) rectally as needed for moderate constipation. Patient not taking: Reported on 08/09/2016 06/23/16   Willy EddyPatrick Robinson, MD  carbidopa-levodopa (SINEMET IR) 25-250 MG tablet Take 1 tablet by mouth 3 (three) times daily.    Historical Provider, MD  pantoprazole (PROTONIX) 40 MG tablet Take 40 mg by mouth daily.    Historical Provider, MD    Allergies Aspirin  No family history on file.  Social History Social History  Substance Use  Topics  . Smoking status: Never Smoker  . Smokeless tobacco: Never Used  . Alcohol use No    Review of Systems Constitutional: No fever/chills Eyes: No visual changes. ENT: No sore throat. Cardiovascular: Denies chest pain. Respiratory: Denies shortness of breath. Gastrointestinal: No abdominal pain.  No nausea, no vomiting.  No diarrhea.  No constipation. Genitourinary: Negative for dysuria. Musculoskeletal: Negative for back pain. Skin: Negative for rash. Neurological: Negative for focal weakness or numbness.  10-point ROS otherwise negative.  ____________________________________________   PHYSICAL EXAM:  VITAL SIGNS: ED Triage Vitals  Enc Vitals Group     BP 08/13/16 1155 (!) 53/35     Pulse Rate 08/13/16 1155 68     Resp 08/13/16 1155 20     Temp 08/13/16 1205 97.8 F (36.6 C)     Temp Source 08/13/16 1205 Oral     SpO2 08/13/16 1155 98 %     Weight --      Height --      Head Circumference --      Peak Flow --      Pain Score --      Pain Loc --      Pain Edu? --      Excl. in GC? --     Constitutional: Alert and oriented. Well appearing and in no acute distress. Eyes: Conjunctivae are normal. PERRL. EOMI. Head: Atraumatic. Nose: No congestion/rhinnorhea. Mouth/Throat: Mucous membranes are moist.  Oropharynx non-erythematous. Neck: No  stridor.  Mild tenderness the C4-C5 midline vertebrae without any deformity, step-off or ecchymosis. Cardiovascular: Normal rate, regular rhythm. Grossly normal heart sounds.  Good peripheral circulation with equal bilateral radial as well as dorsalis pedis pulse.Marland Kitchen Respiratory: Normal respiratory effort.  No retractions. Lungs CTAB. Gastrointestinal: Soft and nontender. No distention.  Musculoskeletal: No lower extremity tenderness nor edema.  No joint effusions. Neurologic:  Normal speech and language. No gross focal neurologic deficits are appreciated.  Skin:  Skin is warm, dry and intact. No rash noted. Psychiatric: Mood  and affect are normal. Speech and behavior are normal.  ____________________________________________   LABS (all labs ordered are listed, but only abnormal results are displayed)  Labs Reviewed  CBC WITH DIFFERENTIAL/PLATELET  BASIC METABOLIC PANEL  TROPONIN I   ____________________________________________  EKG  ED ECG REPORT I, Iyonnah Ferrante,  Teena Irani, the attending physician, personally viewed and interpreted this ECG.   Date: 08/13/2016  EKG Time: 1206  Rate: 73  Rhythm: normal sinus rhythm  Axis: Normal axis  Intervals: Nonspecific intraventricular conduction delay  ST&T Change: No ST segment elevation or depression. No abnormal T-wave inversions. Tremor/baseline disturbance in leads 1, aVL and 3. Likely related to parkinsonian tremor.  ____________________________________________  RADIOLOGY  CT Head Wo Contrast (Final result)  Result time 08/13/16 13:34:22  Final result by Bretta Bang III, MD (08/13/16 13:34:22)           Narrative:   CLINICAL DATA: Pain following fall  EXAM: CT HEAD WITHOUT CONTRAST  CT CERVICAL SPINE WITHOUT CONTRAST  TECHNIQUE: Multidetector CT imaging of the head and cervical spine was performed following the standard protocol without intravenous contrast. Multiplanar CT image reconstructions of the cervical spine were also generated.  COMPARISON: CT head and CT cervical spine November 04, 2015  FINDINGS: CT HEAD FINDINGS  Brain: There is age related volume loss. There is no intracranial mass, hemorrhage, extra-axial fluid collection, or midline shift. There is mild small vessel disease in the centra semiovale bilaterally. Elsewhere gray-white compartments appear normal. There is no evident acute infarct.  Vascular: There is no hyperdense vessel. There is mild calcification in each cavernous region carotid artery. There is also calcification in the distal left vertebral artery.  Skull: The bony calvarium appears  intact.  Sinuses/Orbits: There is mild mucosal thickening in several ethmoid air cells bilaterally. Visualized paranasal sinuses elsewhere clear. Orbits appear symmetric and normal bilaterally.  Other: Mastoid air cells are clear.  CT CERVICAL SPINE FINDINGS  Alignment: There is slight cervicothoracic levoscoliosis. There is minimal stable retrolisthesis of C2 on C3. There is minimal retrolisthesis of C5 on C6. These findings are stable and felt to represent underlying spondylosis.  Skull base and vertebrae: Craniocervical junction region and skull base appear unremarkable. There is no other fracture. No blastic or lytic bone lesions are evident.  Soft tissues and spinal canal: Prevertebral soft tissues and predental space regions are normal. There is no paraspinous lesion. There is no high-grade spinal stenosis.  Disc levels: There is slight disc space narrowing at C2-3 and C4-5. There is moderate disc space narrowing at C5-6 and C7-T1. There is severe disc space narrowing at C6-7. There is facet hypertrophy at most levels bilaterally. There is exit foraminal narrowing due to bony hypertrophy at C2-3 on the right, at C3-4 bilaterally, more severe on the right than on the left, at C4-5 bilaterally, slightly more severe on the left, at C5-6 bilaterally, slightly more severe on the left, and at C6-7 bilaterally, slightly more severe on the  right. There is disc bulging at multiple levels.  Upper Chest: Visualized upper lung zones are clear.  Other: None  IMPRESSION: CT head: Age related volume loss with mild periventricular small vessel disease. No intracranial mass, hemorrhage, extra-axial fluid collection. No acute infarct evident. There is mild vascular calcification at several sites. There is mild mucosal thickening in several ethmoid air cells.  CT cervical spine: No fracture. Stable slight spondylolisthesis at C2-3 and C5-6, felt to be due to underlying spondylosis.  Multilevel arthropathy.   Electronically Signed By: Bretta BangWilliam Woodruff III M.D. On: 08/13/2016 13:34            CT Cervical Spine Wo Contrast (Final result)  Result time 08/13/16 13:34:22  Final result by Bretta BangWilliam Woodruff III, MD (08/13/16 13:34:22)           Narrative:   CLINICAL DATA: Pain following fall  EXAM: CT HEAD WITHOUT CONTRAST  CT CERVICAL SPINE WITHOUT CONTRAST  TECHNIQUE: Multidetector CT imaging of the head and cervical spine was performed following the standard protocol without intravenous contrast. Multiplanar CT image reconstructions of the cervical spine were also generated.  COMPARISON: CT head and CT cervical spine November 04, 2015  FINDINGS: CT HEAD FINDINGS  Brain: There is age related volume loss. There is no intracranial mass, hemorrhage, extra-axial fluid collection, or midline shift. There is mild small vessel disease in the centra semiovale bilaterally. Elsewhere gray-white compartments appear normal. There is no evident acute infarct.  Vascular: There is no hyperdense vessel. There is mild calcification in each cavernous region carotid artery. There is also calcification in the distal left vertebral artery.  Skull: The bony calvarium appears intact.  Sinuses/Orbits: There is mild mucosal thickening in several ethmoid air cells bilaterally. Visualized paranasal sinuses elsewhere clear. Orbits appear symmetric and normal bilaterally.  Other: Mastoid air cells are clear.  CT CERVICAL SPINE FINDINGS  Alignment: There is slight cervicothoracic levoscoliosis. There is minimal stable retrolisthesis of C2 on C3. There is minimal retrolisthesis of C5 on C6. These findings are stable and felt to represent underlying spondylosis.  Skull base and vertebrae: Craniocervical junction region and skull base appear unremarkable. There is no other fracture. No blastic or lytic bone lesions are evident.  Soft tissues and spinal canal:  Prevertebral soft tissues and predental space regions are normal. There is no paraspinous lesion. There is no high-grade spinal stenosis.  Disc levels: There is slight disc space narrowing at C2-3 and C4-5. There is moderate disc space narrowing at C5-6 and C7-T1. There is severe disc space narrowing at C6-7. There is facet hypertrophy at most levels bilaterally. There is exit foraminal narrowing due to bony hypertrophy at C2-3 on the right, at C3-4 bilaterally, more severe on the right than on the left, at C4-5 bilaterally, slightly more severe on the left, at C5-6 bilaterally, slightly more severe on the left, and at C6-7 bilaterally, slightly more severe on the right. There is disc bulging at multiple levels.  Upper Chest: Visualized upper lung zones are clear.  Other: None  IMPRESSION: CT head: Age related volume loss with mild periventricular small vessel disease. No intracranial mass, hemorrhage, extra-axial fluid collection. No acute infarct evident. There is mild vascular calcification at several sites. There is mild mucosal thickening in several ethmoid air cells.  CT cervical spine: No fracture. Stable slight spondylolisthesis at C2-3 and C5-6, felt to be due to underlying spondylosis. Multilevel arthropathy.   Electronically Signed By: Bretta BangWilliam Woodruff III M.D. On: 08/13/2016 13:34  ____________________________________________   PROCEDURES  Procedure(s) performed:   Procedures  Critical Care performed:   ____________________________________________   INITIAL IMPRESSION / ASSESSMENT AND PLAN / ED COURSE  Pertinent labs & imaging results that were available during my care of the patient were reviewed by me and considered in my medical decision making (see chart for details).  ----------------------------------------- 3:23 PM on 08/13/2016 -----------------------------------------  Patient with what appears to be labile blood  pressures likely related to his Parkinson's disease. I discussed case with Dr. Malvin Johns of neurology who is unable to see him later today but says he will be contacting Dr. Sherryll Burger to have the patient scheduled, hopefully for tomorrow. Patient with last blood pressure in the 160s over 90s. No complaints at this time. I discussed with him as well as his daughter to stay hydrated as well as to wear compression stockings. They're understanding of the plan and willing to comply.  Clinical Course      ____________________________________________   FINAL CLINICAL IMPRESSION(S) / ED DIAGNOSES  Autonomic instability. Syncope.    NEW MEDICATIONS STARTED DURING THIS VISIT:  New Prescriptions   No medications on file     Note:  This document was prepared using Dragon voice recognition software and may include unintentional dictation errors.    Myrna Blazer, MD 08/13/16 [phone]

## 2016-08-13 NOTE — ED Notes (Signed)
Pt tried to urinate through urinal, unable to give spec.

## 2016-08-13 NOTE — ED Notes (Signed)
Patient transported to CT 

## 2016-08-13 NOTE — ED Triage Notes (Signed)
Patient to ER for c/o weakness with fall x2 today, 1 last night. Patient has h/o Parkinson's, but has been less responsive since waking this am. Patient verbally unresponsive in triage (normally has conversations, now not speaking at all with head hanging down).

## 2016-09-16 ENCOUNTER — Emergency Department: Payer: Medicare Other

## 2016-09-16 ENCOUNTER — Emergency Department
Admission: EM | Admit: 2016-09-16 | Discharge: 2016-09-17 | Disposition: A | Discharge status: Home / Self Care | Payer: Medicare Other | Attending: Emergency Medicine | Admitting: Emergency Medicine

## 2016-09-16 DIAGNOSIS — G20A1 Parkinson's disease without dyskinesia, without mention of fluctuations: Secondary | ICD-10-CM

## 2016-09-16 DIAGNOSIS — J45909 Unspecified asthma, uncomplicated: Secondary | ICD-10-CM | POA: Insufficient documentation

## 2016-09-16 DIAGNOSIS — G908 Other disorders of autonomic nervous system: Secondary | ICD-10-CM | POA: Diagnosis not present

## 2016-09-16 DIAGNOSIS — G20 Parkinson's disease: Secondary | ICD-10-CM

## 2016-09-16 DIAGNOSIS — I951 Orthostatic hypotension: Secondary | ICD-10-CM

## 2016-09-16 DIAGNOSIS — R42 Dizziness and giddiness: Secondary | ICD-10-CM | POA: Diagnosis present

## 2016-09-16 DIAGNOSIS — G909 Disorder of the autonomic nervous system, unspecified: Secondary | ICD-10-CM

## 2016-09-16 LAB — URINALYSIS, COMPLETE (UACMP) WITH MICROSCOPIC
BACTERIA UA: NONE SEEN
Bilirubin Urine: NEGATIVE
GLUCOSE, UA: NEGATIVE mg/dL
KETONES UR: 5 mg/dL — AB
Leukocytes, UA: NEGATIVE
Nitrite: NEGATIVE
PROTEIN: NEGATIVE mg/dL
Specific Gravity, Urine: 1.015 (ref 1.005–1.030)
pH: 5 (ref 5.0–8.0)

## 2016-09-16 LAB — CBC
HEMATOCRIT: 32.5 % — AB (ref 40.0–52.0)
Hemoglobin: 11.1 g/dL — ABNORMAL LOW (ref 13.0–18.0)
MCH: 31.6 pg (ref 26.0–34.0)
MCHC: 34.1 g/dL (ref 32.0–36.0)
MCV: 92.6 fL (ref 80.0–100.0)
Platelets: 202 10*3/uL (ref 150–440)
RBC: 3.51 MIL/uL — ABNORMAL LOW (ref 4.40–5.90)
RDW: 14.5 % (ref 11.5–14.5)
WBC: 7.5 10*3/uL (ref 3.8–10.6)

## 2016-09-16 LAB — BASIC METABOLIC PANEL
ANION GAP: 6 (ref 5–15)
BUN: 32 mg/dL — AB (ref 6–20)
CHLORIDE: 105 mmol/L (ref 101–111)
CO2: 27 mmol/L (ref 22–32)
Calcium: 8.7 mg/dL — ABNORMAL LOW (ref 8.9–10.3)
Creatinine, Ser: 0.99 mg/dL (ref 0.61–1.24)
GFR calc Af Amer: >60 mL/min (ref >60)
GLUCOSE: 140 mg/dL — AB (ref 65–99)
POTASSIUM: 3.8 mmol/L (ref 3.5–5.1)
Sodium: 138 mmol/L (ref 135–145)

## 2016-09-16 LAB — TROPONIN I: Troponin I: <0.03 ng/mL (ref <0.03)

## 2016-09-16 NOTE — ED Triage Notes (Addendum)
Pt arrives to ED from home via [facility] with c/o weakness and dizziness x2 weeks. EMS reports pt had 2 episodes of dizziness yesterday resulting in falls without injury or LOC. EMS reports pt seen several times for similair c/o, without definitive cause found. EMS reports initial BP of 100/60 dropped to 90/40 upon standing. Pt given 250 NS bolus en route by EMS with EMS reporting BP then improved to 108/68.

## 2016-09-16 NOTE — ED Notes (Signed)
Pt brought in via ems from home with weakness for 2 weeks.  Pt reports hurting to take adeep breath.  Denies chest pain.  Hx parkinsons dz.  2 iv's in place.  Nonsmoker.  Pt alert.  Skin warm and dry. Sinus on monitor.

## 2016-09-16 NOTE — ED Notes (Signed)
Family in with pt    Pt fell last night and hit head on unknown object.  Abrasion to forehead.  Pt is not on blood thinners.  Pt has more confusion recently per granddaughter.  Willie CourierAshley [phone]-  [phone].  Willie LippsAntoine Hale- [phone]-[phone].  Pt alert.  Skin warm and dry  Iv in place

## 2016-09-17 NOTE — ED Notes (Signed)
D/c inst to granddaughter.  Iv dced.  Pt alert.  No acute distress.

## 2016-09-17 NOTE — ED Notes (Signed)
Family in with pt now.  Family requesting to sign pt out to take to another facility.  Dr brown aware.

## 2016-09-17 NOTE — Discharge Instructions (Signed)
As we discussed, there is no evidence of emergent medical condition at this time.  We believe that Mr. Dareen Pianonderson continues to suffer from autonomic dysfunction and orthostatic hypotension as a result of his Parkinson's disease.  There is no easy fix to this condition.  We offered placement in a skilled nursing facility but you prefer to take him home at this time which we believe is appropriate.  Of course he continues to be a fall risk and we encourage you to take any precautions possible to prevent injury.  Please call his neurologist at the next available opportunity for a follow-up visit.

## 2016-09-17 NOTE — ED Provider Notes (Signed)
[facility] Emergency Department Provider Note  ____________________________________________   First MD Initiated Contact with Patient 09/16/16 2152     (approximate)  I have reviewed the triage vital signs and the nursing notes.   HISTORY  Chief Complaint Weakness and Dizziness    HPI Docia FurlJoe Rials is a 83 y.o. male with a history of Parkinson's disease with autonomic dysfunction and orthostatic hypotension who has presented to this ED and to [facility] multiple times for falls and hypotension.  He presents tonight by EMS for evaluation of similar symptoms including recent falls, persistent episodes of hypotension, and concern on the part of the family for his blood pressure which varies wildly.  His granddaughers are his primary caregivers, and one of them presents with him tonight.  She reports that the patient has been to [facility] multiple times, and was admitted for an extended period [facility] for the same issue, and it has not yet been fixed.  They are concerned about the persistent symptoms and the fact that neither Dr. Sherryll BurgerShah (his neurologist) nor any of his other  doctors have been able to identify the cause.   He denies recent illness and other medical symptoms as described below in ROS.  Reports some left lateral and posterior rib pain, possibly from recent fall.  Reports hematoma on frontal scalp from recent fall. Denies neck and back pain.  Denies chest pain, SOB, abd pain, N/V/D.  Symptoms are severe and nothing seems to be helping.  Past Medical History:  Diagnosis Date  . Asthma   . DDD (degenerative disc disease), cervical   . Dementia   . Parkinson disease (HCC)   . Reflux     There are no active problems to display for this patient.   Past Surgical History:  Procedure Laterality Date  . EYE SURGERY    . HERNIA REPAIR      Prior to Admission medications   Medication Sig Start Date End Date Taking? Authorizing Provider  bisacodyl  (DULCOLAX) 10 MG suppository Place 1 suppository (10 mg total) rectally as needed for moderate constipation. 06/23/16   Willy EddyPatrick Robinson, MD  carbidopa-levodopa (SINEMET IR) 25-250 MG tablet Take 1 tablet by mouth 3 (three) times daily.    Historical Provider, MD  pantoprazole (PROTONIX) 40 MG tablet Take 40 mg by mouth daily.    Historical Provider, MD    Allergies Aspirin  No family history on file.  Social History Social History  Substance Use Topics  . Smoking status: Never Smoker  . Smokeless tobacco: Never Used  . Alcohol use No    Review of Systems Constitutional: No fever/chills Eyes: No visual changes. ENT: No sore throat. Cardiovascular: Denies chest pain.  Frequent drops of blood pressure Respiratory: Denies shortness of breath. Gastrointestinal: No abdominal pain.  No nausea, no vomiting.  No diarrhea.  No constipation. Genitourinary: Negative for dysuria. Musculoskeletal: Negative for back pain. Skin: Negative for rash. Neurological: Negative for headaches, focal weakness or numbness.  Generalized weakness with frequent falls and dizziness.  10-point ROS otherwise negative.  ____________________________________________   PHYSICAL EXAM:  VITAL SIGNS: ED Triage Vitals  Enc Vitals Group     BP 09/16/16 2000 (!) 110/94     Pulse Rate 09/16/16 2006 61     Resp 09/16/16 2000 20     Temp 09/16/16 2006 98.2 F (36.8 C)     Temp Source 09/16/16 2006 Oral     SpO2 09/16/16 2006 99 %  Weight --      Height --      Head Circumference --      Peak Flow --      Pain Score --      Pain Loc --      Pain Edu? --      Excl. in GC? --     Constitutional: Alert and oriented, seems somewhat confused but this is apparently his baseline. No acute distress.  Seems happy, eating a bag of pork rinds in the exam room Eyes: Conjunctivae are normal. PERRL. EOMI. Head: Atraumatic. Nose: No congestion/rhinnorhea. Mouth/Throat: Mucous membranes are moist.  Oropharynx  non-erythematous. Neck: No stridor.  No meningeal signs.  No cervical spine tenderness to palpation. Cardiovascular: Normal rate, regular rhythm. Good peripheral circulation. Grossly normal heart sounds. Respiratory: Normal respiratory effort.  No retractions. Lungs CTAB. Gastrointestinal: Soft and nontender. No distention.  Musculoskeletal: No lower extremity tenderness nor edema. No gross deformities of extremities. Neurologic:  Normal speech and language. No gross focal neurologic deficits are appreciated.  Skin:  Skin is warm, dry and intact. No rash noted.   ____________________________________________   LABS (all labs ordered are listed, but only abnormal results are displayed)  Labs Reviewed  BASIC METABOLIC PANEL - Abnormal; Notable for the following:       Result Value   Glucose, Bld 140 (*)    BUN 32 (*)    Calcium 8.7 (*)    All other components within normal limits  CBC - Abnormal; Notable for the following:    RBC 3.51 (*)    Hemoglobin 11.1 (*)    HCT 32.5 (*)    All other components within normal limits  URINALYSIS, COMPLETE (UACMP) WITH MICROSCOPIC - Abnormal; Notable for the following:    Color, Urine YELLOW (*)    APPearance CLEAR (*)    Hgb urine dipstick SMALL (*)    Ketones, ur 5 (*)    Squamous Epithelial / LPF 0-5 (*)    All other components within normal limits  TROPONIN I   ____________________________________________   RADIOLOGY   No results found.  ____________________________________________   PROCEDURES  Procedure(s) performed:   Procedures   Critical Care performed: No ____________________________________________   INITIAL IMPRESSION / ASSESSMENT AND PLAN / ED COURSE  Pertinent labs & imaging results that were available during my care of the patient were reviewed by me and considered in my medical decision making (see chart for details).  I had an extended conversation with the patient and his granddaughters (one each on two  separate occasions).  After the initial H&P, I looked in the EMR for past visits.  His BPs are widely variable.  He had an extended and thorough evaluation at [facility], and the consensus is that the autonomic instability is a result of the Parkinson's disease.  There is no definitive treatment for this condition, and he appeared to be discharged to a SNF for rehab.  I asked the granddaughter about this, and she admitted that he was discharged to a SNF, but he did not like it, so he wanted to go home.  Now they are having difficulty managing him and home and are understandably concerned about his well-being and health given the persistent issues, frequent falls, etc.   I explained that there is no evidence of an acute or emergent medical condition at this time, and that there is little we can do for him in the hospital.  I suggested we can keep him in the  ED for social work and PT/OT evaluation and placement in a SNF tomorrow, but the family would rather take him home and have him follow up with Dr. Sherryll Burger for additional recommendations.  I think this is appropriate.  I advised them that of course he continues to be a falls risk, and to do their best to minimize the risks to avoid future injuries.  Family understands, acknowledges the dangers inherent to his medical condition, and will transport him home.  I gave my usual and customary return precautions.      ____________________________________________  FINAL CLINICAL IMPRESSION(S) / ED DIAGNOSES  Final diagnoses:  Autonomic dysfunction  Orthostatic hypotension  Parkinson disease (HCC)     MEDICATIONS GIVEN DURING THIS VISIT:  Medications - No data to display   NEW OUTPATIENT MEDICATIONS STARTED DURING THIS VISIT:  Discharge Medication List as of 09/17/2016 12:29 AM      Discharge Medication List as of 09/17/2016 12:29 AM      Discharge Medication List as of 09/17/2016 12:29 AM       Note:  This document was prepared using Dragon voice  recognition software and may include unintentional dictation errors.    Loleta Rose, MD 09/17/16 2326

## 2016-10-30 ENCOUNTER — Encounter: Payer: Self-pay | Admitting: Emergency Medicine

## 2016-10-30 ENCOUNTER — Emergency Department
Admission: EM | Admit: 2016-10-30 | Discharge: 2016-10-30 | Disposition: A | Discharge status: Home / Self Care | Payer: Medicare Other | Attending: Emergency Medicine | Admitting: Emergency Medicine

## 2016-10-30 DIAGNOSIS — R32 Unspecified urinary incontinence: Secondary | ICD-10-CM | POA: Diagnosis present

## 2016-10-30 DIAGNOSIS — G20 Parkinson's disease: Secondary | ICD-10-CM | POA: Diagnosis not present

## 2016-10-30 DIAGNOSIS — J45909 Unspecified asthma, uncomplicated: Secondary | ICD-10-CM | POA: Diagnosis not present

## 2016-10-30 DIAGNOSIS — N3000 Acute cystitis without hematuria: Secondary | ICD-10-CM

## 2016-10-30 LAB — URINALYSIS, COMPLETE (UACMP) WITH MICROSCOPIC
Bilirubin Urine: NEGATIVE
GLUCOSE, UA: NEGATIVE mg/dL
Ketones, ur: NEGATIVE mg/dL
Nitrite: POSITIVE — AB
Protein, ur: NEGATIVE mg/dL
SPECIFIC GRAVITY, URINE: 1.011 (ref 1.005–1.030)
pH: 5 (ref 5.0–8.0)

## 2016-10-30 MED ORDER — CEFUROXIME AXETIL 500 MG PO TABS
500.0000 mg | ORAL_TABLET | Freq: Once | ORAL | Status: AC
  Administered 2016-10-30: 500 mg via ORAL
  Filled 2016-10-30: qty 1

## 2016-10-30 MED ORDER — CEFDINIR 125 MG/5ML PO SUSR: 300.0000 mg | Freq: Once | ORAL | Status: DC

## 2016-10-30 MED ORDER — CEFDINIR 300 MG PO CAPS: 300.0000 mg | ORAL_CAPSULE | Freq: Two times a day (BID) | ORAL | 0 refills | Status: AC

## 2016-10-30 NOTE — ED Provider Notes (Signed)
[facility] Emergency Department Provider Note  ____________________________________________   First MD Initiated Contact with Patient 10/30/16 1114     (approximate)  I have reviewed the triage vital signs and the nursing notes.   HISTORY  Chief Complaint Urinary Incontinence   HPI Willie Hale is a 83 y.o. male with a one-day history of urinary continence as well as burning with urination as presents to emergency department for further evaluation. He denies having any prostate issues. However, he doesn't a history of UTIs treated with Ceftin air. No reports of fever or pain besides the burning.   Past Medical History:  Diagnosis Date  . Asthma   . DDD (degenerative disc disease), cervical   . Dementia   . Parkinson disease (HCC)   . Reflux     There are no active problems to display for this patient.   Past Surgical History:  Procedure Laterality Date  . EYE SURGERY    . HERNIA REPAIR      Prior to Admission medications   Medication Sig Start Date End Date Taking? Authorizing Provider  bisacodyl (DULCOLAX) 10 MG suppository Place 1 suppository (10 mg total) rectally as needed for moderate constipation. 06/23/16   Willy EddyPatrick Robinson, MD  carbidopa-levodopa (SINEMET IR) 25-250 MG tablet Take 1 tablet by mouth 3 (three) times daily.    Historical Provider, MD  pantoprazole (PROTONIX) 40 MG tablet Take 40 mg by mouth daily.    Historical Provider, MD    Allergies Aspirin  History reviewed. No pertinent family history.  Social History Social History  Substance Use Topics  . Smoking status: Never Smoker  . Smokeless tobacco: Never Used  . Alcohol use No    Review of Systems Constitutional: No fever/chills Eyes: No visual changes. ENT: No sore throat. Cardiovascular: Denies chest pain. Respiratory: Denies shortness of breath. Gastrointestinal: No abdominal pain.  No nausea, no vomiting.  No diarrhea.  No constipation. Genitourinary:  as above Musculoskeletal: Negative for back pain. Skin: Negative for rash. Neurological: Negative for headaches, focal weakness or numbness.  10-point ROS otherwise negative.  ____________________________________________   PHYSICAL EXAM:  VITAL SIGNS: ED Triage Vitals  Enc Vitals Group     BP 10/30/16 1031 94/60     Pulse Rate 10/30/16 1031 64     Resp 10/30/16 1031 20     Temp 10/30/16 1031 97.4 F (36.3 C)     Temp Source 10/30/16 1031 Oral     SpO2 10/30/16 1031 100 %     Weight 10/30/16 1027 180 lb (81.6 kg)     Height --      Head Circumference --      Peak Flow --      Pain Score 10/30/16 1027 7     Pain Loc --      Pain Edu? --      Excl. in GC? --     Constitutional: Alert and oriented. Well appearing and in no acute distress. Eyes: Conjunctivae are normal. PERRL. EOMI. Head: Atraumatic. Nose: No congestion/rhinnorhea. Mouth/Throat: Mucous membranes are moist.   Neck: No stridor.   Cardiovascular: Normal rate, regular rhythm. Grossly normal heart sounds.   Respiratory: Normal respiratory effort.  No retractions. Lungs CTAB. Gastrointestinal: Soft and nontender. No distention.  Genitourinary:  Normal external exam in this circumcised male. No lesions. No yeast like buildup. Musculoskeletal: No lower extremity tenderness nor edema.  No joint effusions. No tenderness to the lumbar and thoracic spines. 5 out of 5 strength to  bilateral lower extremities. No saddle anesthesia. Neurologic:  Normal speech and language. No gross focal neurologic deficits are appreciated. Skin:  Skin is warm, dry and intact. No rash noted. Psychiatric: Mood and affect are normal. Speech and behavior are normal.  ____________________________________________   LABS (all labs ordered are listed, but only abnormal results are displayed)  Labs Reviewed  URINALYSIS, COMPLETE (UACMP) WITH MICROSCOPIC - Abnormal; Notable for the following:       Result Value   Color, Urine YELLOW (*)     APPearance CLOUDY (*)    Hgb urine dipstick MODERATE (*)    Nitrite POSITIVE (*)    Leukocytes, UA LARGE (*)    Bacteria, UA RARE (*)    Squamous Epithelial / LPF 0-5 (*)    All other components within normal limits   ____________________________________________  EKG   ____________________________________________  RADIOLOGY   ____________________________________________   PROCEDURES  Procedure(s) performed:   Procedures  Critical Care performed:   ____________________________________________   INITIAL IMPRESSION / ASSESSMENT AND PLAN / ED COURSE  Pertinent labs & imaging results that were available during my care of the patient were reviewed by me and considered in my medical decision making (see chart for details).  ----------------------------------------- 12:13 PM on 10/30/2016 -----------------------------------------  We will send the urine for culture. Past reports show cefdinir susceptible bacteria on care everywhere. However, there is no culture that I could find in the lab results section. We will send for culture today. Discussed treatment plan as well as the findings with the patient as well as granddaughter who is at the bedside. They're understanding of the plan and willing to comply.      ____________________________________________   FINAL CLINICAL IMPRESSION(S) / ED DIAGNOSES  UTI    NEW MEDICATIONS STARTED DURING THIS VISIT:  New Prescriptions   No medications on file     Note:  This document was prepared using Dragon voice recognition software and may include unintentional dictation errors.    Myrna Blazer, MD 10/30/16 1215

## 2016-10-30 NOTE — ED Notes (Signed)
Pt taken to main ED, report given to Amy

## 2016-10-30 NOTE — ED Triage Notes (Signed)
Pt to ed with c/o urinary incontinence, frequency, and burning with urination.  Pt also reports lower back pain.

## 2016-10-30 NOTE — ED Notes (Signed)
granddaughter states pt has had sudden urinary incontinence with burning and frequency, states he normally is able to control his urine, states hx of parkinson's and that he is bed bound, at present pt is awake and alert, wearing depends

## 2016-11-01 LAB — URINE CULTURE: Culture: >=100000 — AB

## 2017-03-24 DEATH — deceased

## 2017-05-14 IMAGING — CT CT HEAD W/O CM
3 of 4 series · 15 of 47 positions shown, 18 images · non-contrast
Comparison: 08/13/2016 head CT

CLINICAL DATA: Weakness x2 weeks.  Parkinson's.

EXAM:
CT HEAD WITHOUT CONTRAST
TECHNIQUE: Contiguous axial images were obtained from the base of the skull
through the vertex without intravenous contrast.

[Series 2: head wo · axial · 0.42mm/px · z∈[-196,-66]mm · 9 of 32 slices shown, 12 images]
[im 3/32  brain]
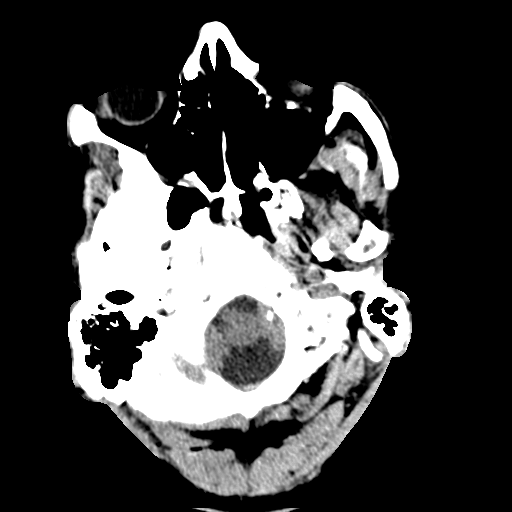
[im 3/32  bone]
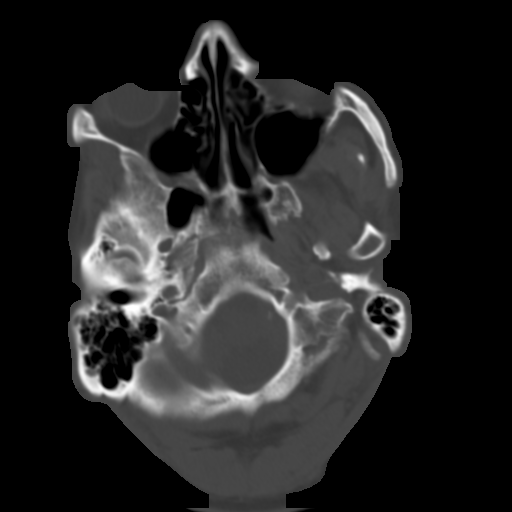
[im 7/32  brain]
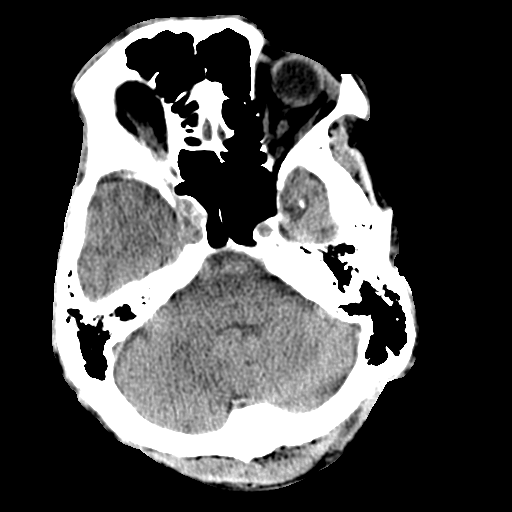
[im 9/32  brain]
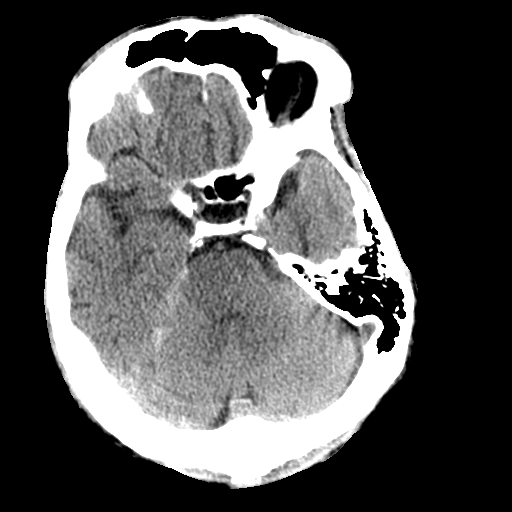
[im 13/32  brain]
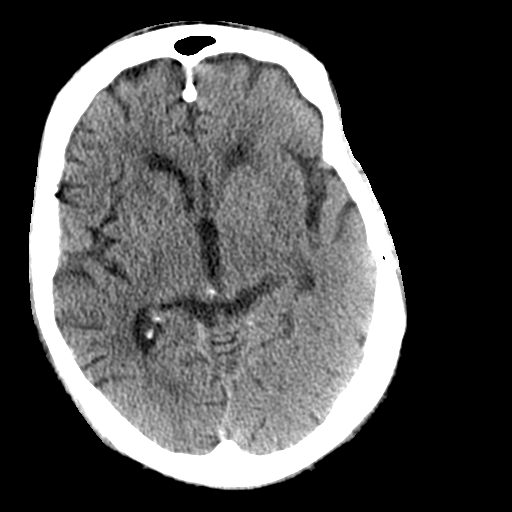
[im 17/32  brain]
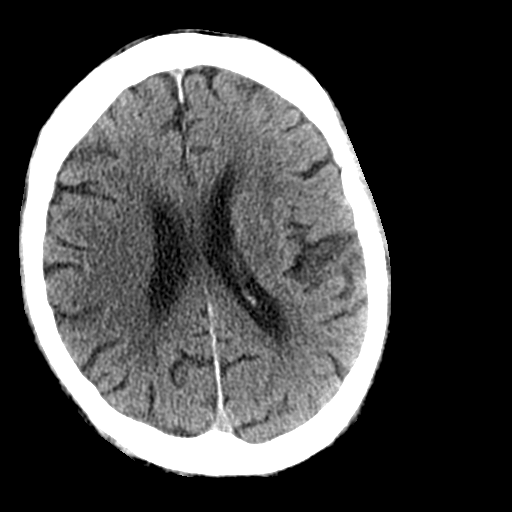
[im 17/32  bone]
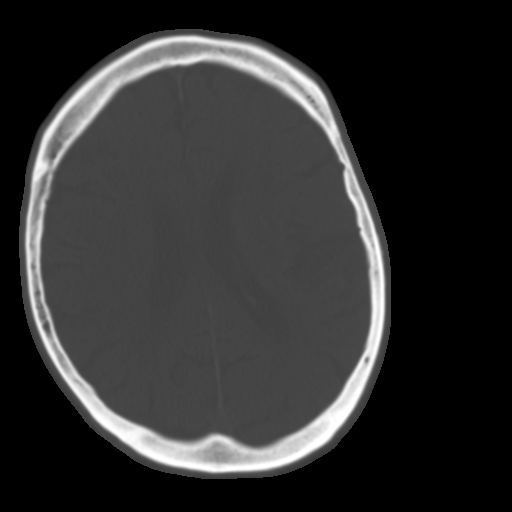
[im 19/32  brain]
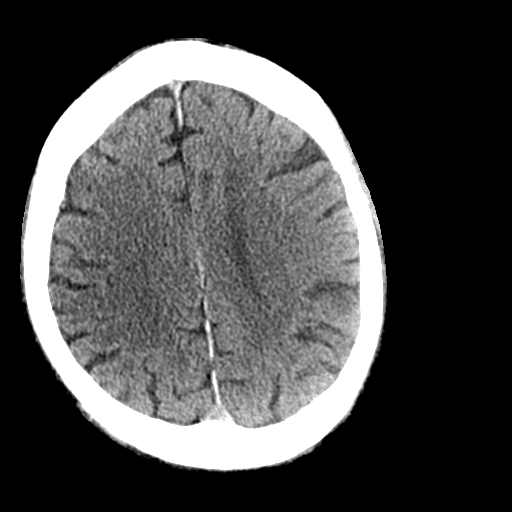
[im 23/32  brain]
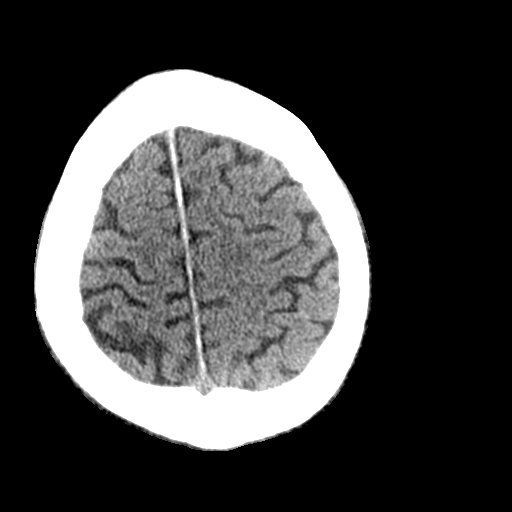
[im 25/32  brain]
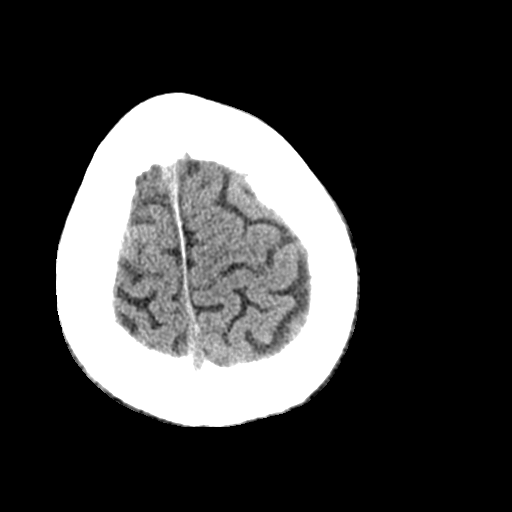
[im 29/32  brain]
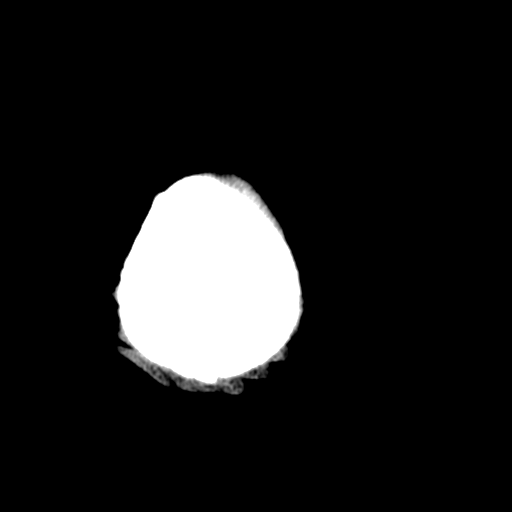
[im 29/32  bone]
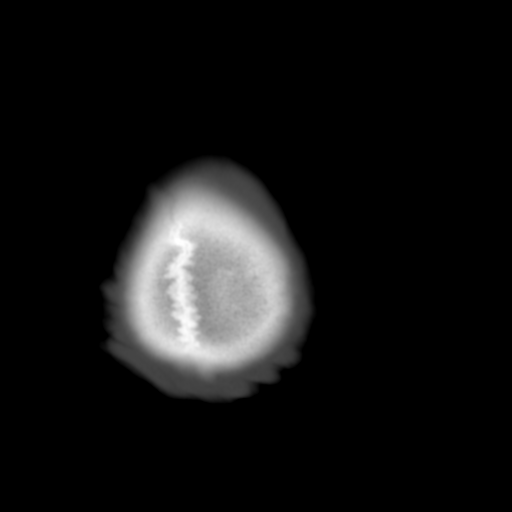

[Series 4: coronal soft tissue · coronal · 0.31mm/px · 3 of 69 slices shown]
[im 23/69  brain]
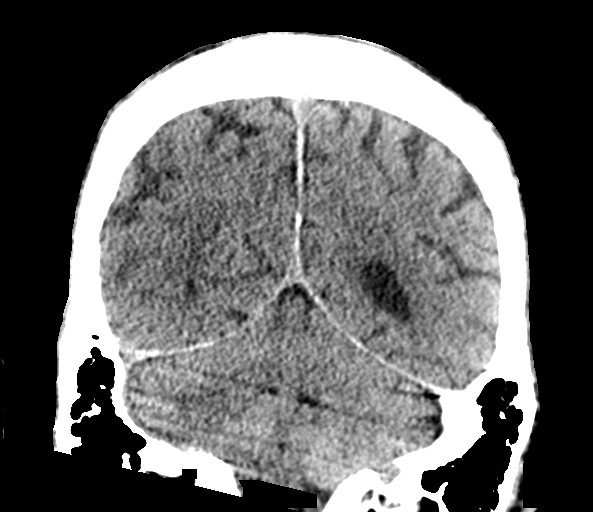
[im 31/69  brain]
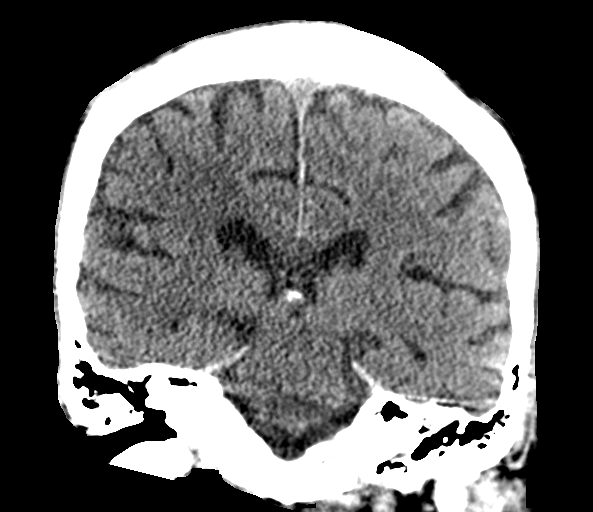
[im 38/69  brain]
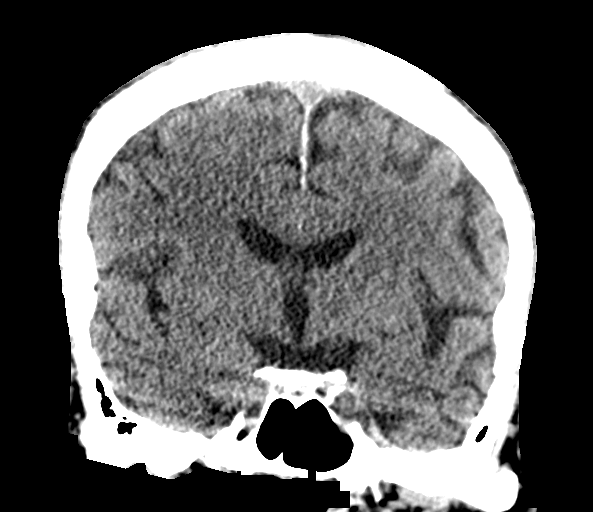

[Series 5: sagittal soft tissue · sagittal · 0.31mm/px · 3 of 52 slices shown]
[im 20/52  brain]
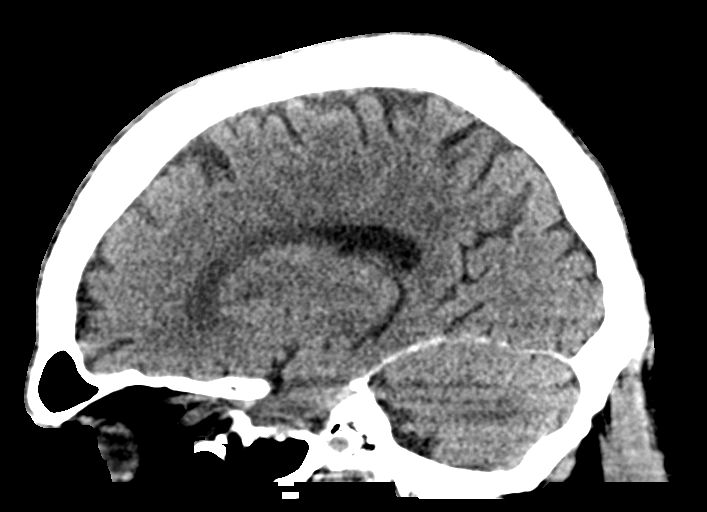
[im 26/52  brain]
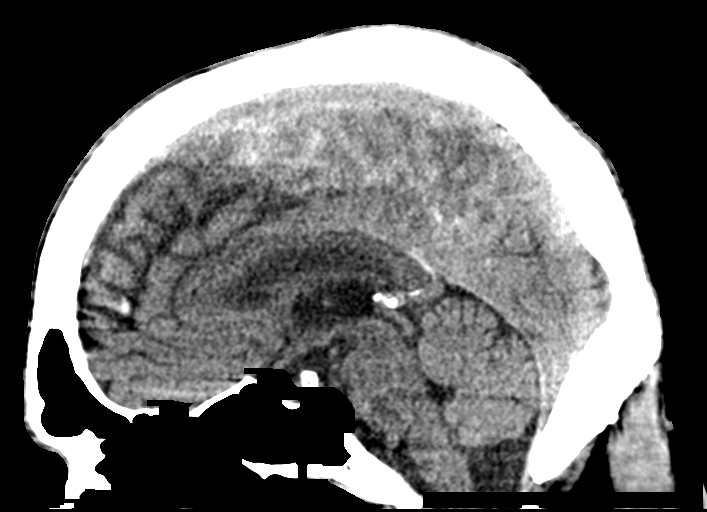
[im 32/52  brain]
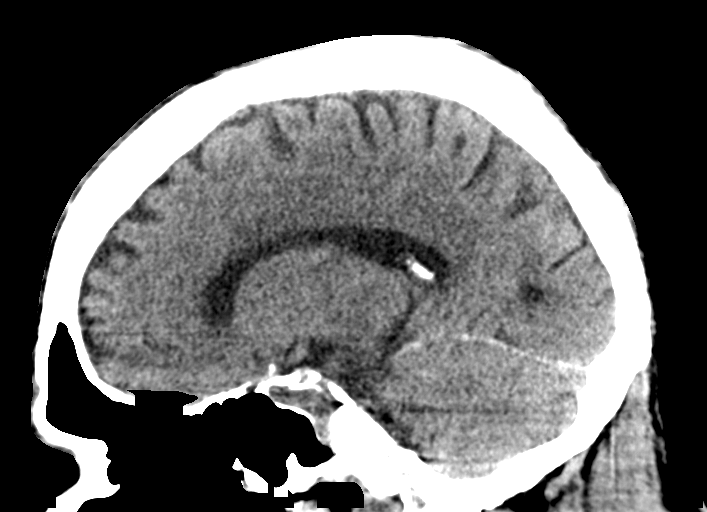

[15 of 47 positions shown; findings below may reference images not displayed]

FINDINGS: Brain: Chronic small vessel ischemic disease of periventricular
white matter. No acute large vascular territory infarction. No
hemorrhage, intra-axial mass nor extra-axial fluid. No midline shift
or edema.

Vascular: No hyperdense vessels. The carotid siphon calcifications
bilaterally.

Skull: No acute skull fracture.

Sinuses/Orbits: The mastoids.  No acute sinus disease.

Other: None
IMPRESSION: Mild chronic small vessel ischemic disease of periventricular white
matter. No acute intracranial abnormality.
